# Patient Record
Sex: Male | Born: 1955 | Race: White | Hispanic: No | Marital: Married | Smoking: Former smoker
Health system: Southern US, Community
[De-identification: ages and names within clinical notes are randomized; demographics above are authoritative.]

## PROBLEM LIST (undated history)

## (undated) DIAGNOSIS — N182 Chronic kidney disease, stage 2 (mild): Secondary | ICD-10-CM

## (undated) DIAGNOSIS — I10 Essential (primary) hypertension: Secondary | ICD-10-CM

## (undated) DIAGNOSIS — K219 Gastro-esophageal reflux disease without esophagitis: Secondary | ICD-10-CM

## (undated) DIAGNOSIS — N529 Male erectile dysfunction, unspecified: Secondary | ICD-10-CM

## (undated) DIAGNOSIS — C801 Malignant (primary) neoplasm, unspecified: Secondary | ICD-10-CM

## (undated) DIAGNOSIS — I2510 Atherosclerotic heart disease of native coronary artery without angina pectoris: Secondary | ICD-10-CM

## (undated) DIAGNOSIS — I77810 Thoracic aortic ectasia: Secondary | ICD-10-CM

## (undated) DIAGNOSIS — K760 Fatty (change of) liver, not elsewhere classified: Secondary | ICD-10-CM

## (undated) DIAGNOSIS — Z87442 Personal history of urinary calculi: Secondary | ICD-10-CM

## (undated) DIAGNOSIS — E119 Type 2 diabetes mellitus without complications: Secondary | ICD-10-CM

## (undated) HISTORY — DX: Chronic kidney disease, stage 2 (mild): N18.2

## (undated) HISTORY — DX: Male erectile dysfunction, unspecified: N52.9

## (undated) HISTORY — DX: Gastro-esophageal reflux disease without esophagitis: K21.9

## (undated) HISTORY — DX: Thoracic aortic ectasia: I77.810

## (undated) HISTORY — DX: Type 2 diabetes mellitus without complications: E11.9

## (undated) HISTORY — DX: Atherosclerotic heart disease of native coronary artery without angina pectoris: I25.10

## (undated) HISTORY — DX: Essential (primary) hypertension: I10

## (undated) HISTORY — DX: Fatty (change of) liver, not elsewhere classified: K76.0

---
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
## 1998-12-29 HISTORY — PX: NASAL SINUS SURGERY: SHX719

## 2001-10-04 ENCOUNTER — Encounter: Payer: Self-pay | Admitting: Vascular Surgery

## 2001-10-08 ENCOUNTER — Ambulatory Visit (HOSPITAL_COMMUNITY): Admission: RE | Admit: 2001-10-08 | Discharge: 2001-10-08 | Payer: Self-pay | Admitting: Vascular Surgery

## 2005-12-29 HISTORY — PX: SHOULDER SURGERY: SHX246

## 2009-03-26 LAB — PULMONARY FUNCTION TEST

## 2014-12-29 HISTORY — PX: OTHER SURGICAL HISTORY: SHX169

## 2014-12-29 HISTORY — PX: CATARACT EXTRACTION, BILATERAL: SHX1313

## 2017-10-12 ENCOUNTER — Ambulatory Visit (INDEPENDENT_AMBULATORY_CARE_PROVIDER_SITE_OTHER): Payer: BLUE CROSS/BLUE SHIELD | Admitting: Pulmonary Disease

## 2017-10-12 ENCOUNTER — Encounter: Payer: Self-pay | Admitting: Pulmonary Disease

## 2017-10-12 VITALS — BP 130/84 | HR 90 | Ht 73.0 in | Wt 228.4 lb

## 2017-10-12 DIAGNOSIS — R0600 Dyspnea, unspecified: Secondary | ICD-10-CM

## 2017-10-12 DIAGNOSIS — K219 Gastro-esophageal reflux disease without esophagitis: Secondary | ICD-10-CM | POA: Diagnosis not present

## 2017-10-12 LAB — PULMONARY FUNCTION TEST
DL/VA % pred: 86 %
DL/VA: 4.08 ml/min/mmHg/L
DLCO cor % pred: 91 %
DLCO cor: 32.06 ml/min/mmHg
DLCO unc % pred: 96 %
DLCO unc: 33.65 ml/min/mmHg
FEF 25-75 Post: 2.95 L/s
FEF 25-75 Pre: 2.84 L/s
FEF2575-%Change-Post: 3 %
FEF2575-%Pred-Post: 96 %
FEF2575-%Pred-Pre: 92 %
FEV1-%Change-Post: 1 %
FEV1-%Pred-Post: 106 %
FEV1-%Pred-Pre: 104 %
FEV1-Post: 4.03 L
FEV1-Pre: 3.97 L
FEV1FVC-%Change-Post: 2 %
FEV1FVC-%Pred-Pre: 98 %
FEV6-%Change-Post: 0 %
FEV6-%Pred-Post: 109 %
FEV6-%Pred-Pre: 110 %
FEV6-Post: 5.28 L
FEV6-Pre: 5.33 L
FEV6FVC-%Change-Post: 0 %
FEV6FVC-%Pred-Post: 104 %
FEV6FVC-%Pred-Pre: 104 %
FVC-%Change-Post: 0 %
FVC-%Pred-Post: 105 %
FVC-%Pred-Pre: 106 %
FVC-Post: 5.34 L
FVC-Pre: 5.4 L
Post FEV1/FVC ratio: 75 %
Post FEV6/FVC ratio: 99 %
Pre FEV1/FVC ratio: 74 %
Pre FEV6/FVC Ratio: 99 %
RV % pred: 111 %
RV: 2.67 L
TLC % pred: 108 %
TLC: 8 L

## 2017-10-12 NOTE — Patient Instructions (Addendum)
For your shortness of breath: Today's test did not show COPD but I still think we need to evaluate your lungs given year smoking history. We will arrange for a Pulmonary function test and CT scan of your chest If those are normal then we will arrange for evaluation of your acid reflux and hiatal hernia  For the GERD: Follow the lifestyle modification sheet we gave you  We will see you back in 2 weeks with an NP or sooner if needed

## 2017-10-12 NOTE — Progress Notes (Signed)
Subjective:    Patient ID: Terry Conley, male    DOB: 09-04-1956, 61 y.o.   MRN: [id number]  HPI Chief Complaint  Patient presents with  . Advice Only    Referred by Dr. Quillian Quince for SOB.     Shortness of breath: > he says it is worse with talking.  The more he talks the more his voice goes away > he feels like he has been holding his breath for a long time when he is dyspneic > he notes that he has been sweating more than usual > has been going on for 6-8 months > he says that he was exposed to some mold under his mother's house in April; he had some shortness of breath not long after this > exertion makes him more short of breath > he is more sensitive to strong smells and perfumes > lying flat makes it worse sometimes > no chest pain > some ankle swelling late > some wheezing, again it comes and goes  He was prescribed Anoro by his PCP and he says that this made his breathing worse and made his chest hurt.  He took it for less than a month.  No childhood respiratory illnesses.  He smoked 3/4ppd for 30 years, quit 11-12 years.  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  He worked around this for 10 years.  He didn't recall any breathing difficulty around it.    He has occasional bouts of bronchitis, not annually.  Never hospitalized for a respiratory problem.   He was told by a doctor once that he had COPD, but another physician saw him and disagreed with this diagnosis.  He had PFTs years ago at [facility].   He notes that his A1c is now greater than 7.  He has gained some weight in the last year, perhaps 12-15 pounds.    Cough: > he sometimes produces white mucus, not every day > most days his cough is dry   Past Medical History:  Diagnosis Date  . Diabetes ([place])   . ED (erectile dysfunction)   . GERD (gastroesophageal reflux disease)   . Hypertension       Family History  Problem Relation Age of Onset  . Breast cancer Paternal Grandmother      Social History   Social History  . Marital status: Married    Spouse name: N/A  . Number of children: N/A  . Years of education: N/A   Occupational History  . Not on file.   Social History Main Topics  . Smoking status: Former Smoker    Packs/day: 0.75    Years: 30.00    Types: Cigarettes    Quit date: 10/12/2006  . Smokeless tobacco: Never Used  . Alcohol use Not on file  . Drug use: Unknown  . Sexual activity: Not on file   Other Topics Concern  . Not on file   Social History Narrative  . No narrative on file     Allergies  Allergen Reactions  . Codeine Nausea Only     No outpatient prescriptions prior to visit.   No facility-administered medications prior to visit.       Review of Systems  Constitutional: Negative for fever and unexpected weight change.  HENT: Positive for congestion. Negative for dental problem, ear pain, nosebleeds, postnasal drip, rhinorrhea, sinus pressure, sneezing, sore throat and trouble swallowing.   Eyes: Negative  for redness and itching.  Respiratory: Positive for cough and shortness of breath. Negative for chest tightness and wheezing.   Cardiovascular: Negative for palpitations and leg swelling.  Gastrointestinal: Negative for nausea and vomiting.  Genitourinary: Negative for dysuria.  Musculoskeletal: Negative for joint swelling.  Skin: Negative for rash.  Neurological: Negative for headaches.  Hematological: Does not bruise/bleed easily.  Psychiatric/Behavioral: Negative for dysphoric mood. The patient is not nervous/anxious.        Objective:   Physical Exam Vitals:   10/12/17 1346  BP: 130/84  Pulse: 90  SpO2: 96%  Weight: 228 lb 6.4 oz (103.6 kg)  Height: 6\' 1"  (1.854 m)   RA  Gen: well appearing, no acute distress HENT: NCAT, OP clear, neck supple without masses Eyes: PERRL, EOMi Lymph: no cervical  lymphadenopathy PULM: CTA B CV: RRR, no mgr, no JVD GI: BS+, soft, nontender, no hsm Derm: 2+ ankle edema bilaterally R>L, no rash or skin breakdown MSK: normal bulk and tone Neuro: A&Ox4, CN II-XII intact, strength 5/5 in all 4 extremities Psyche: normal mood and affect   CBC No results found for: WBC, RBC, HGB, HCT, PLT, MCV, MCH, MCHC, RDW, LYMPHSABS, MONOABS, EOSABS, BASOSABS  Chest imaging: 02/2017 CXR : images independently reviewed, I question post sternal emphysema.      Assessment & Plan:   Dyspnea, unspecified type - Plan: Spirometry with Graph, CBC w/Diff, Pulmonary function test, CT Chest Wo Contrast  Gastroesophageal reflux disease, esophagitis presence not specified  Discussion: [name] is at high risk for having COPD considering his smoking history over 30 years. I believe that he may have some emphysema on his chest x-ray from earlier this year. We need to get a spirometry test today in the office to assess for COPD.  That being said, the characteristics of his illness are strongly suggestive of acid reflux causing laryngeal irritation. Specifically the worsening shortness of breath with talking and worsening shortness of breath while lying flat. He says that he has a hiatal hernia and his acid reflux has been worse.  Today's spirometry test did not show evidence of airflow obstruction. I still think we need to assess for lung disease with a full pulmonary function test and a CT given his smoking history. However, if these tests are normal then I think we need to assess his acid reflux and hiatal hernia as a cause of his symptoms.  Plan: For your shortness of breath: Today's test did not show COPD but I still think we need to evaluate your lungs given year smoking history. We will arrange for a Pulmonary function test and CT scan of your chest If those are normal then we will arrange for evaluation of your acid reflux and hiatal hernia  For the GERD: Follow the  lifestyle modification sheet we gave you  We will see you back in 2 weeks with an NP or sooner if needed    Current Outpatient Prescriptions:  .  aspirin EC 81 MG tablet, Take 81 mg by mouth daily., Disp: , Rfl:  .  Cinnamon 500 MG capsule, Take 500 mg by mouth 2 (two) times daily., Disp: , Rfl:  .  Fish Oil-Cholecalciferol (OMEGA-3 FISH OIL-VITAMIN D3) 1200-1000 MG-UNIT CAPS, Take 1 capsule by mouth daily., Disp: , Rfl:  .  indapamide (LOZOL) 2.5 MG tablet, Take 2.5 mg by mouth daily., Disp: , Rfl:  .  lisinopril (PRINIVIL,ZESTRIL) 40 MG tablet, Take 40 mg by mouth daily., Disp: , Rfl:  .  omeprazole (PRILOSEC)  20 MG capsule, Take 20 mg by mouth 2 (two) times daily before a meal., Disp: , Rfl:  .  tamsulosin (FLOMAX) 0.4 MG CAPS capsule, Take 0.4 mg by mouth at bedtime., Disp: , Rfl:  .  testosterone cypionate (DEPOTESTOTERONE CYPIONATE) 100 MG/ML injection, Inject 100 mg into the muscle every 14 (fourteen) days. For IM use only, Disp: , Rfl:

## 2017-10-12 NOTE — Patient Instructions (Signed)
PFT done today. 

## 2017-10-15 ENCOUNTER — Encounter: Payer: Self-pay | Admitting: Pulmonary Disease

## 2017-10-15 DIAGNOSIS — R0600 Dyspnea, unspecified: Secondary | ICD-10-CM | POA: Insufficient documentation

## 2017-10-21 ENCOUNTER — Institutional Professional Consult (permissible substitution): Payer: Self-pay | Admitting: Pulmonary Disease

## 2017-10-28 ENCOUNTER — Ambulatory Visit: Payer: BLUE CROSS/BLUE SHIELD | Admitting: Adult Health

## 2017-11-10 ENCOUNTER — Telehealth: Payer: Self-pay | Admitting: Pulmonary Disease

## 2017-11-10 NOTE — Telephone Encounter (Signed)
Spoke with pt, he states he will request a the results from his scan.and have them fax over the report to [place]. He will have to cancel appointment for tomorrow and hopes BQ would call him with results. I cancelled appt.

## 2017-11-10 NOTE — Telephone Encounter (Signed)
Spoke with pt, states that his wife recently had kidney and pancreas transplant and he cannot leave her alone at this time, and cannot bring her into office visit on 11/14 to discuss CT and PFT results.  After reviewing chart I see pt's PFT results but do not see where pt had CT.  Per pt, he had CT chest at [place].  I advised pt that we do not have access to these images.  As I was speaking to pt and asking him to request CT disk from facility, and line was cut off.  Called pt back, had to lmtcb.

## 2017-11-10 NOTE — Telephone Encounter (Signed)
Patient stated line was disconnected. Patient is calling back.

## 2017-11-11 ENCOUNTER — Ambulatory Visit: Payer: BLUE CROSS/BLUE SHIELD | Admitting: Adult Health

## 2017-11-11 NOTE — Telephone Encounter (Signed)
Message will be routed to [facility] for follow up.

## 2017-11-12 ENCOUNTER — Telehealth: Payer: Self-pay | Admitting: Pulmonary Disease

## 2017-11-12 NOTE — Telephone Encounter (Signed)
Thanks, let him know I'll review ASAP.  The report from [place] said his lungs were OK, please let him know.

## 2017-11-12 NOTE — Telephone Encounter (Signed)
Left a detailed message letting pt know BQ message. Advised to call back if he had any questions. Will close encounter.

## 2017-11-12 NOTE — Telephone Encounter (Signed)
BQ the disc will be placed in your look at folder.

## 2017-11-12 NOTE — Telephone Encounter (Signed)
Called and spoke to [place] with [facility] and requested that disc be mailed to our office.  Will route to [place] to f/u on.

## 2017-11-12 NOTE — Telephone Encounter (Signed)
We need the images from that scan sent to the office please.

## 2017-11-18 NOTE — Telephone Encounter (Signed)
Terry Conley, have you received the images for this patient? Thanks.

## 2017-11-18 NOTE — Telephone Encounter (Signed)
Images were received. Caryl Pina has them. BQ will go over results. Nothing further needed at this time.

## 2018-08-02 ENCOUNTER — Encounter: Payer: Self-pay | Admitting: *Deleted

## 2018-08-02 ENCOUNTER — Ambulatory Visit: Payer: BLUE CROSS/BLUE SHIELD | Admitting: Cardiovascular Disease

## 2018-08-02 ENCOUNTER — Encounter: Payer: Self-pay | Admitting: Cardiovascular Disease

## 2018-08-02 VITALS — BP 118/80 | HR 97 | Ht 73.0 in | Wt 236.0 lb

## 2018-08-02 DIAGNOSIS — I10 Essential (primary) hypertension: Secondary | ICD-10-CM

## 2018-08-02 DIAGNOSIS — E119 Type 2 diabetes mellitus without complications: Secondary | ICD-10-CM

## 2018-08-02 DIAGNOSIS — Z01812 Encounter for preprocedural laboratory examination: Secondary | ICD-10-CM | POA: Diagnosis not present

## 2018-08-02 DIAGNOSIS — R0609 Other forms of dyspnea: Secondary | ICD-10-CM

## 2018-08-02 DIAGNOSIS — R0600 Dyspnea, unspecified: Secondary | ICD-10-CM

## 2018-08-02 MED ORDER — METOPROLOL TARTRATE 50 MG PO TABS: 50.0000 mg | ORAL_TABLET | Freq: Once | ORAL | 0 refills | Status: DC

## 2018-08-02 NOTE — Patient Instructions (Signed)
Medication Instructions:  Continue all current medications.  Labwork:  BMET - order given today.   Do just prior to CT.  Testing/Procedures:  Coronary CT - done at [facility] will contact with results via phone or letter.    Follow-Up: 2 months   Any Other Special Instructions Will Be Listed Below (If Applicable).  If you need a refill on your cardiac medications before your next appointment, please call your pharmacy.

## 2018-08-02 NOTE — Progress Notes (Signed)
CARDIOLOGY CONSULT NOTE  Patient ID: Terry Conley MRN: [id number] DOB/AGE: 08-25-1956 62 y.o.  Admit date: (Not on file) Primary Physician: Caryl Bis, MD Referring Physician: Dr. Quillian Quince  Reason for Consultation: Exertional dyspnea  HPI: Terry Conley is a 62 y.o. male who is being seen today for the evaluation of exertional dyspnea at the request of Caryl Bis, MD.  I reviewed notes from his PCP.  I reviewed labs dated 06/03/2018: Hemoglobin 14.9, platelets 228, white blood cells 5.8, TSH 4, total CK 196.  I reviewed the echocardiogram report dated 05/26/2017 performed at [facility] which demonstrated normal left ventricular systolic and diastolic function, LVEF 55 to 60%.  He has been evaluated by pulmonary as well.  Chest CT in October 2018 did not demonstrate any pulmonary abnormalities.  ECG performed in the office today which I ordered and personally interpreted demonstrates normal sinus rhythm with frequent PVCs.  Upon speaking with him further, it appears he has been expensing exertional dyspnea for the past 8 or 9 months.  He describes a chest fullness like "being stuffed with cotton "but denies chest pain per se.  When walking out in the yard he becomes quickly diaphoretic.  He denies orthopnea and paroxysmal nocturnal dyspnea.  He said "I hurt all over "and describes diffuse body pain.  He has occasional right leg swelling but did have right varicose vein stripping years ago.  He also complains of bilateral hip pain.  Family history: Father had coronary artery disease, MIs, and carotid endarterectomy in his 17s.   Allergies  Allergen Reactions  . Codeine Nausea Only    Current Outpatient Medications  Medication Sig Dispense Refill  . aspirin EC 81 MG tablet Take 81 mg by mouth daily.    Marland Kitchen atorvastatin (LIPITOR) 10 MG tablet Take 10 mg by mouth once a week.    . Cinnamon 500 MG capsule Take 500 mg by mouth 2 (two) times daily.    . Fish  Oil-Cholecalciferol (OMEGA-3 FISH OIL-VITAMIN D3) 1200-1000 MG-UNIT CAPS Take 1 capsule by mouth daily.    . indapamide (LOZOL) 2.5 MG tablet Take 2.5 mg by mouth daily.    Marland Kitchen lisinopril (PRINIVIL,ZESTRIL) 20 MG tablet Take 20 mg by mouth daily.    . metFORMIN (GLUCOPHAGE) 500 MG tablet Take 500 mg by mouth 2 (two) times daily with a meal.    . omeprazole (PRILOSEC) 20 MG capsule Take 20 mg by mouth 2 (two) times daily before a meal.    . Specialty Vitamins Products (PROSTATE PO) Take by mouth. OTC 3 TIMES DAILY    . tamsulosin (FLOMAX) 0.4 MG CAPS capsule Take 0.4 mg by mouth at bedtime.    Marland Kitchen testosterone cypionate (DEPOTESTOTERONE CYPIONATE) 100 MG/ML injection Inject 100 mg into the muscle every 14 (fourteen) days. For IM use only     No current facility-administered medications for this visit.     Past Medical History:  Diagnosis Date  . Diabetes ([place])   . ED (erectile dysfunction)   . GERD (gastroesophageal reflux disease)   . Hypertension     Past Surgical History:  Procedure Laterality Date  . APPENDECTOMY  1961  . CATARACT EXTRACTION, BILATERAL  2016  . hammertoe repair Right 2016  . NASAL SINUS SURGERY  2000   deviated septum, polyp removal  . SHOULDER SURGERY Left 2007    Social History   Socioeconomic History  . Marital status: Married    Spouse name: Not on file  .  Number of children: Not on file  . Years of education: Not on file  . Highest education level: Not on file  Occupational History  . Not on file  Social Needs  . Financial resource strain: Not on file  . Food insecurity:    Worry: Not on file    Inability: Not on file  . Transportation needs:    Medical: Not on file    Non-medical: Not on file  Tobacco Use  . Smoking status: Former Smoker    Packs/day: 0.75    Years: 30.00    Pack years: 22.50    Types: Cigarettes    Last attempt to quit: 10/12/2006    Years since quitting: 11.8  . Smokeless tobacco: Never Used  Substance and Sexual  Activity  . Alcohol use: Not on file  . Drug use: Not on file  . Sexual activity: Not on file  Lifestyle  . Physical activity:    Days per week: Not on file    Minutes per session: Not on file  . Stress: Not on file  Relationships  . Social connections:    Talks on phone: Not on file    Gets together: Not on file    Attends religious service: Not on file    Active member of club or organization: Not on file    Attends meetings of clubs or organizations: Not on file    Relationship status: Not on file  . Intimate partner violence:    Fear of current or ex partner: Not on file    Emotionally abused: Not on file    Physically abused: Not on file    Forced sexual activity: Not on file  Other Topics Concern  . Not on file  Social History Narrative  . Not on file      Current Meds  Medication Sig  . aspirin EC 81 MG tablet Take 81 mg by mouth daily.  Marland Kitchen atorvastatin (LIPITOR) 10 MG tablet Take 10 mg by mouth once a week.  . Cinnamon 500 MG capsule Take 500 mg by mouth 2 (two) times daily.  . Fish Oil-Cholecalciferol (OMEGA-3 FISH OIL-VITAMIN D3) 1200-1000 MG-UNIT CAPS Take 1 capsule by mouth daily.  . indapamide (LOZOL) 2.5 MG tablet Take 2.5 mg by mouth daily.  Marland Kitchen lisinopril (PRINIVIL,ZESTRIL) 20 MG tablet Take 20 mg by mouth daily.  . metFORMIN (GLUCOPHAGE) 500 MG tablet Take 500 mg by mouth 2 (two) times daily with a meal.  . omeprazole (PRILOSEC) 20 MG capsule Take 20 mg by mouth 2 (two) times daily before a meal.  . Specialty Vitamins Products (PROSTATE PO) Take by mouth. OTC 3 TIMES DAILY  . tamsulosin (FLOMAX) 0.4 MG CAPS capsule Take 0.4 mg by mouth at bedtime.  Marland Kitchen testosterone cypionate (DEPOTESTOTERONE CYPIONATE) 100 MG/ML injection Inject 100 mg into the muscle every 14 (fourteen) days. For IM use only  . [DISCONTINUED] lisinopril (PRINIVIL,ZESTRIL) 40 MG tablet Take 40 mg by mouth daily.      Review of systems complete and found to be negative unless listed above in  HPI    Physical exam Blood pressure 118/80, pulse 97, height 6\' 1"  (1.854 m), weight 236 lb (107 kg), SpO2 99 %. General: NAD Neck: No JVD, no thyromegaly or thyroid nodule.  Lungs: Clear to auscultation bilaterally with normal respiratory effort. CV: Nondisplaced PMI. Regular rate and rhythm, normal S1/S2, no S3/S4, no murmur.  No peripheral edema.  No carotid bruit.  Abdomen: Soft, nontender, no distention.  Skin: Intact without lesions or rashes.  Neurologic: Alert and oriented x 3.  Psych: Normal affect. Extremities: No clubbing or cyanosis.  HEENT: Normal.   ECG: Most recent ECG reviewed.   Labs: No results found for: K, BUN, CREATININE, ALT, TSH, HGB   Lipids: No results found for: LDLCALC, LDLDIRECT, CHOL, TRIG, HDL      ASSESSMENT AND PLAN:  1.  Exertional dyspnea: Pulmonary etiology has been ruled out.  He does have hyperlipidemia and type 2 diabetes mellitus along with hypertension.  I will obtain coronary CT angiography to assess for coronary artery disease.  2.  Hypertension: Blood pressures have been fluctuating recently.  It is presently controlled.  No changes to therapy.  He is on lisinopril 20 mg.  3.  Type 2 diabetes mellitus: Currently on metformin and Lipitor.     Disposition: Follow up in 8 to 10 weeks  Signed: Kate Sable, M.D., F.A.C.C.  08/02/2018, 2:43 PM

## 2018-08-16 LAB — BASIC METABOLIC PANEL
BUN: 10 mg/dL (ref 7–25)
CHLORIDE: 105 mmol/L (ref 98–110)
CO2: 28 mmol/L (ref 20–32)
CREATININE: 1.15 mg/dL (ref 0.70–1.25)
Calcium: 9.3 mg/dL (ref 8.6–10.3)
Glucose, Bld: 107 mg/dL (ref 65–139)
POTASSIUM: 4.4 mmol/L (ref 3.5–5.3)
SODIUM: 139 mmol/L (ref 135–146)

## 2018-08-19 ENCOUNTER — Ambulatory Visit (HOSPITAL_COMMUNITY)
Admission: RE | Admit: 2018-08-19 | Discharge: 2018-08-19 | Disposition: A | Discharge status: Home / Self Care | Payer: BLUE CROSS/BLUE SHIELD | Source: Ambulatory Visit | Attending: Cardiovascular Disease | Admitting: Cardiovascular Disease

## 2018-08-19 DIAGNOSIS — R0600 Dyspnea, unspecified: Secondary | ICD-10-CM

## 2018-08-19 DIAGNOSIS — I77810 Thoracic aortic ectasia: Secondary | ICD-10-CM | POA: Diagnosis not present

## 2018-08-19 DIAGNOSIS — R0609 Other forms of dyspnea: Secondary | ICD-10-CM | POA: Diagnosis present

## 2018-08-19 DIAGNOSIS — J9811 Atelectasis: Secondary | ICD-10-CM | POA: Diagnosis not present

## 2018-08-19 DIAGNOSIS — I2510 Atherosclerotic heart disease of native coronary artery without angina pectoris: Secondary | ICD-10-CM | POA: Diagnosis not present

## 2018-08-19 MED ORDER — IOPAMIDOL (ISOVUE-370) INJECTION 76%
100.0000 mL | Freq: Once | INTRAVENOUS | Status: AC | PRN
  Administered 2018-08-19: 100 mL via INTRAVENOUS

## 2018-08-19 MED ORDER — METOPROLOL TARTRATE 5 MG/5ML IV SOLN
INTRAVENOUS | Status: AC
  Administered 2018-08-19: 5 mg via INTRAVENOUS
  Filled 2018-08-19: qty 10

## 2018-08-19 MED ORDER — NITROGLYCERIN 0.4 MG SL SUBL
0.8000 mg | SUBLINGUAL_TABLET | SUBLINGUAL | Status: DC | PRN
  Administered 2018-08-19: 0.8 mg via SUBLINGUAL
  Filled 2018-08-19 (×2): qty 25

## 2018-08-19 MED ORDER — NITROGLYCERIN 0.4 MG SL SUBL
SUBLINGUAL_TABLET | SUBLINGUAL | Status: AC
  Administered 2018-08-19: 0.8 mg via SUBLINGUAL
  Filled 2018-08-19: qty 2

## 2018-08-19 MED ORDER — METOPROLOL TARTRATE 5 MG/5ML IV SOLN
5.0000 mg | INTRAVENOUS | Status: DC | PRN
  Administered 2018-08-19: 5 mg via INTRAVENOUS
  Filled 2018-08-19 (×2): qty 5

## 2018-08-23 ENCOUNTER — Telehealth: Payer: Self-pay | Admitting: Cardiovascular Disease

## 2018-08-23 ENCOUNTER — Encounter: Payer: Self-pay | Admitting: *Deleted

## 2018-08-23 ENCOUNTER — Ambulatory Visit (INDEPENDENT_AMBULATORY_CARE_PROVIDER_SITE_OTHER): Payer: BLUE CROSS/BLUE SHIELD | Admitting: Cardiovascular Disease

## 2018-08-23 ENCOUNTER — Other Ambulatory Visit: Payer: Self-pay | Admitting: Cardiovascular Disease

## 2018-08-23 ENCOUNTER — Encounter: Payer: Self-pay | Admitting: Cardiovascular Disease

## 2018-08-23 VITALS — BP 118/80 | HR 104 | Ht 73.0 in | Wt 229.0 lb

## 2018-08-23 DIAGNOSIS — R0600 Dyspnea, unspecified: Secondary | ICD-10-CM

## 2018-08-23 DIAGNOSIS — I10 Essential (primary) hypertension: Secondary | ICD-10-CM

## 2018-08-23 DIAGNOSIS — R0609 Other forms of dyspnea: Secondary | ICD-10-CM | POA: Diagnosis not present

## 2018-08-23 DIAGNOSIS — I209 Angina pectoris, unspecified: Secondary | ICD-10-CM | POA: Diagnosis not present

## 2018-08-23 DIAGNOSIS — Z01812 Encounter for preprocedural laboratory examination: Secondary | ICD-10-CM

## 2018-08-23 DIAGNOSIS — E119 Type 2 diabetes mellitus without complications: Secondary | ICD-10-CM

## 2018-08-23 DIAGNOSIS — I25118 Atherosclerotic heart disease of native coronary artery with other forms of angina pectoris: Secondary | ICD-10-CM

## 2018-08-23 MED ORDER — ATORVASTATIN CALCIUM 40 MG PO TABS: 40.0000 mg | ORAL_TABLET | Freq: Every day | ORAL | 6 refills | Status: DC

## 2018-08-23 MED ORDER — METOPROLOL TARTRATE 25 MG PO TABS: 25.0000 mg | ORAL_TABLET | Freq: Two times a day (BID) | ORAL | 6 refills | Status: DC

## 2018-08-23 NOTE — Addendum Note (Signed)
Addended by: Laurine Blazer on: 08/23/2018 02:13 PM   Modules accepted: Orders

## 2018-08-23 NOTE — Progress Notes (Signed)
SUBJECTIVE: The patient returns for follow-up after undergoing cardiovascular testing performed for the evaluation of exertional dyspnea.  Coronary CT angiography was suggestive of significant stenosis in the mid to distal LAD and distal AV groove branch of circumflex.  He continues to experience exertional dyspnea with minimal activity.  It is associated with diaphoresis.  He also continues to describe a fullness in the chest.  He denies pain per se.     Review of Systems: As per "subjective", otherwise negative.  Allergies  Allergen Reactions  . Codeine Nausea Only    Current Outpatient Medications  Medication Sig Dispense Refill  . aspirin EC 81 MG tablet Take 81 mg by mouth daily.    Marland Kitchen atorvastatin (LIPITOR) 10 MG tablet Take 10 mg by mouth once a week.    . Cinnamon 500 MG capsule Take 500 mg by mouth 2 (two) times daily.    . Fish Oil-Cholecalciferol (OMEGA-3 FISH OIL-VITAMIN D3) 1200-1000 MG-UNIT CAPS Take 1 capsule by mouth daily.    . indapamide (LOZOL) 2.5 MG tablet Take 2.5 mg by mouth daily.    Marland Kitchen lisinopril (PRINIVIL,ZESTRIL) 20 MG tablet Take 20 mg by mouth daily.    . metFORMIN (GLUCOPHAGE) 500 MG tablet Take 500 mg by mouth 2 (two) times daily with a meal.    . metoprolol tartrate (LOPRESSOR) 50 MG tablet Take 1 tablet (50 mg total) by mouth once for 1 dose. 1 tablet 0  . omeprazole (PRILOSEC) 20 MG capsule Take 20 mg by mouth 2 (two) times daily before a meal.    . Specialty Vitamins Products (PROSTATE PO) Take by mouth. OTC 3 TIMES DAILY    . tamsulosin (FLOMAX) 0.4 MG CAPS capsule Take 0.4 mg by mouth at bedtime.    Marland Kitchen testosterone cypionate (DEPOTESTOTERONE CYPIONATE) 100 MG/ML injection Inject 100 mg into the muscle every 14 (fourteen) days. For IM use only     No current facility-administered medications for this visit.     Past Medical History:  Diagnosis Date  . Diabetes ([place])   . ED (erectile dysfunction)   . GERD (gastroesophageal reflux  disease)   . Hypertension     Past Surgical History:  Procedure Laterality Date  . APPENDECTOMY  1961  . CATARACT EXTRACTION, BILATERAL  2016  . hammertoe repair Right 2016  . NASAL SINUS SURGERY  2000   deviated septum, polyp removal  . SHOULDER SURGERY Left 2007    Social History   Socioeconomic History  . Marital status: Married    Spouse name: Not on file  . Number of children: Not on file  . Years of education: Not on file  . Highest education level: Not on file  Occupational History  . Not on file  Social Needs  . Financial resource strain: Not on file  . Food insecurity:    Worry: Not on file    Inability: Not on file  . Transportation needs:    Medical: Not on file    Non-medical: Not on file  Tobacco Use  . Smoking status: Former Smoker    Packs/day: 0.75    Years: 30.00    Pack years: 22.50    Types: Cigarettes    Last attempt to quit: 10/12/2006    Years since quitting: 11.8  . Smokeless tobacco: Never Used  Substance and Sexual Activity  . Alcohol use: Not on file  . Drug use: Not on file  . Sexual activity: Not on file  Lifestyle  .  Physical activity:    Days per week: Not on file    Minutes per session: Not on file  . Stress: Not on file  Relationships  . Social connections:    Talks on phone: Not on file    Gets together: Not on file    Attends religious service: Not on file    Active member of club or organization: Not on file    Attends meetings of clubs or organizations: Not on file    Relationship status: Not on file  . Intimate partner violence:    Fear of current or ex partner: Not on file    Emotionally abused: Not on file    Physically abused: Not on file    Forced sexual activity: Not on file  Other Topics Concern  . Not on file  Social History Narrative  . Not on file     Vitals:   08/23/18 1335  BP: 118/80  Pulse: (!) 104  SpO2: 98%  Weight: 229 lb (103.9 kg)  Height: 6\' 1"  (1.854 m)    Wt Readings from Last 3  Encounters:  08/23/18 229 lb (103.9 kg)  08/02/18 236 lb (107 kg)  10/12/17 228 lb 6.4 oz (103.6 kg)     PHYSICAL EXAM General: NAD HEENT: Normal. Neck: No JVD, no thyromegaly. Lungs: Clear to auscultation bilaterally with normal respiratory effort. CV: Regular rate and rhythm, normal S1/S2, no S3/S4, no murmur. No pretibial or periankle edema.  No carotid bruit.   Abdomen: Soft, nontender, no distention.  Neurologic: Alert and oriented.  Psych: Normal affect. Skin: Normal. Musculoskeletal: No gross deformities.    ECG: Reviewed above under Subjective   Labs: Lab Results  Component Value Date/Time   K 4.4 08/16/2018 10:37 AM   BUN 10 08/16/2018 10:37 AM   CREATININE 1.15 08/16/2018 10:37 AM     Lipids: No results found for: LDLCALC, LDLDIRECT, CHOL, TRIG, HDL     ASSESSMENT AND PLAN: 1.  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  He is on aspirin and atorvastatin 10 mg.  I will increase atorvastatin to 40 mg.  I will start metoprolol tartrate 25 mg twice daily.  I will also arrange for left heart catheterization and coronary angiography. Risks and benefits of cardiac catheterization have been discussed with the patient.  These include bleeding, infection, kidney damage, stroke, heart attack, death.  The patient understands these risks and is willing to proceed.  2.  Hypertension: Blood pressures are normal.  I will monitor given addition of metoprolol as noted above.  3.  Type 2 diabetes mellitus: Currently on metformin and Lipitor.  I will increase Lipitor to 40 mg for reasons mentioned above (coronary artery disease).   Disposition: Follow up 1 month  A high level of decision making was required for increased medical complexities.    Kate Sable, M.D., F.A.C.C.

## 2018-08-23 NOTE — H&P (View-Only) (Signed)
SUBJECTIVE: The patient returns for follow-up after undergoing cardiovascular testing performed for the evaluation of exertional dyspnea.  Coronary CT angiography was suggestive of significant stenosis in the mid to distal LAD and distal AV groove branch of circumflex.  He continues to experience exertional dyspnea with minimal activity.  It is associated with diaphoresis.  He also continues to describe a fullness in the chest.  He denies pain per se.     Review of Systems: As per "subjective", otherwise negative.  Allergies  Allergen Reactions  . Codeine Nausea Only    Current Outpatient Medications  Medication Sig Dispense Refill  . aspirin EC 81 MG tablet Take 81 mg by mouth daily.    Marland Kitchen atorvastatin (LIPITOR) 10 MG tablet Take 10 mg by mouth once a week.    . Cinnamon 500 MG capsule Take 500 mg by mouth 2 (two) times daily.    . Fish Oil-Cholecalciferol (OMEGA-3 FISH OIL-VITAMIN D3) 1200-1000 MG-UNIT CAPS Take 1 capsule by mouth daily.    . indapamide (LOZOL) 2.5 MG tablet Take 2.5 mg by mouth daily.    Marland Kitchen lisinopril (PRINIVIL,ZESTRIL) 20 MG tablet Take 20 mg by mouth daily.    . metFORMIN (GLUCOPHAGE) 500 MG tablet Take 500 mg by mouth 2 (two) times daily with a meal.    . metoprolol tartrate (LOPRESSOR) 50 MG tablet Take 1 tablet (50 mg total) by mouth once for 1 dose. 1 tablet 0  . omeprazole (PRILOSEC) 20 MG capsule Take 20 mg by mouth 2 (two) times daily before a meal.    . Specialty Vitamins Products (PROSTATE PO) Take by mouth. OTC 3 TIMES DAILY    . tamsulosin (FLOMAX) 0.4 MG CAPS capsule Take 0.4 mg by mouth at bedtime.    Marland Kitchen testosterone cypionate (DEPOTESTOTERONE CYPIONATE) 100 MG/ML injection Inject 100 mg into the muscle every 14 (fourteen) days. For IM use only     No current facility-administered medications for this visit.     Past Medical History:  Diagnosis Date  . Diabetes ([place])   . ED (erectile dysfunction)   . GERD (gastroesophageal reflux  disease)   . Hypertension     Past Surgical History:  Procedure Laterality Date  . APPENDECTOMY  1961  . CATARACT EXTRACTION, BILATERAL  2016  . hammertoe repair Right 2016  . NASAL SINUS SURGERY  2000   deviated septum, polyp removal  . SHOULDER SURGERY Left 2007    Social History   Socioeconomic History  . Marital status: Married    Spouse name: Not on file  . Number of children: Not on file  . Years of education: Not on file  . Highest education level: Not on file  Occupational History  . Not on file  Social Needs  . Financial resource strain: Not on file  . Food insecurity:    Worry: Not on file    Inability: Not on file  . Transportation needs:    Medical: Not on file    Non-medical: Not on file  Tobacco Use  . Smoking status: Former Smoker    Packs/day: 0.75    Years: 30.00    Pack years: 22.50    Types: Cigarettes    Last attempt to quit: 10/12/2006    Years since quitting: 11.8  . Smokeless tobacco: Never Used  Substance and Sexual Activity  . Alcohol use: Not on file  . Drug use: Not on file  . Sexual activity: Not on file  Lifestyle  .  Physical activity:    Days per week: Not on file    Minutes per session: Not on file  . Stress: Not on file  Relationships  . Social connections:    Talks on phone: Not on file    Gets together: Not on file    Attends religious service: Not on file    Active member of club or organization: Not on file    Attends meetings of clubs or organizations: Not on file    Relationship status: Not on file  . Intimate partner violence:    Fear of current or ex partner: Not on file    Emotionally abused: Not on file    Physically abused: Not on file    Forced sexual activity: Not on file  Other Topics Concern  . Not on file  Social History Narrative  . Not on file     Vitals:   08/23/18 1335  BP: 118/80  Pulse: (!) 104  SpO2: 98%  Weight: 229 lb (103.9 kg)  Height: 6\' 1"  (1.854 m)    Wt Readings from Last 3  Encounters:  08/23/18 229 lb (103.9 kg)  08/02/18 236 lb (107 kg)  10/12/17 228 lb 6.4 oz (103.6 kg)     PHYSICAL EXAM General: NAD HEENT: Normal. Neck: No JVD, no thyromegaly. Lungs: Clear to auscultation bilaterally with normal respiratory effort. CV: Regular rate and rhythm, normal S1/S2, no S3/S4, no murmur. No pretibial or periankle edema.  No carotid bruit.   Abdomen: Soft, nontender, no distention.  Neurologic: Alert and oriented.  Psych: Normal affect. Skin: Normal. Musculoskeletal: No gross deformities.    ECG: Reviewed above under Subjective   Labs: Lab Results  Component Value Date/Time   K 4.4 08/16/2018 10:37 AM   BUN 10 08/16/2018 10:37 AM   CREATININE 1.15 08/16/2018 10:37 AM     Lipids: No results found for: LDLCALC, LDLDIRECT, CHOL, TRIG, HDL     ASSESSMENT AND PLAN: 1.  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  He is on aspirin and atorvastatin 10 mg.  I will increase atorvastatin to 40 mg.  I will start metoprolol tartrate 25 mg twice daily.  I will also arrange for left heart catheterization and coronary angiography. Risks and benefits of cardiac catheterization have been discussed with the patient.  These include bleeding, infection, kidney damage, stroke, heart attack, death.  The patient understands these risks and is willing to proceed.  2.  Hypertension: Blood pressures are normal.  I will monitor given addition of metoprolol as noted above.  3.  Type 2 diabetes mellitus: Currently on metformin and Lipitor.  I will increase Lipitor to 40 mg for reasons mentioned above (coronary artery disease).   Disposition: Follow up 1 month  A high level of decision making was required for increased medical complexities.    Kate Sable, M.D., F.A.C.C.

## 2018-08-23 NOTE — Telephone Encounter (Signed)
°  Precert needed for: Left heart cath - Kelly - 8/30 - 9:00    Location: [facility]    Date:

## 2018-08-23 NOTE — Patient Instructions (Signed)
Medication Instructions:   Increase Lipitor to 40mg  daily.  Begin Lopressor 25mg  twice a day.  Continue all other medications.    Labwork: BMET, CBC - orders given today.   Testing/Procedures: Your physician has requested that you have a cardiac catheterization. Cardiac catheterization is used to diagnose and/or treat various heart conditions. Doctors may recommend this procedure for a number of different reasons. The most common reason is to evaluate chest pain. Chest pain can be a symptom of coronary artery disease (CAD), and cardiac catheterization can show whether plaque is narrowing or blocking your heart's arteries. This procedure is also used to evaluate the valves, as well as measure the blood flow and oxygen levels in different parts of your heart. For further information please visit [email or web address]. Please follow instruction sheet, as given.  Follow-Up: 1 month   Any Other Special Instructions Will Be Listed Below (If Applicable).  If you need a refill on your cardiac medications before your next appointment, please call your pharmacy.

## 2018-08-24 ENCOUNTER — Telehealth: Payer: Self-pay | Admitting: *Deleted

## 2018-08-24 LAB — CBC
HEMATOCRIT: 45.8 % (ref 38.5–50.0)
HEMOGLOBIN: 14.7 g/dL (ref 13.2–17.1)
MCH: 26.2 pg — ABNORMAL LOW (ref 27.0–33.0)
MCHC: 32.1 g/dL (ref 32.0–36.0)
MCV: 81.6 fL (ref 80.0–100.0)
MPV: 11.3 fL (ref 7.5–12.5)
Platelets: 312 10*3/uL (ref 140–400)
RBC: 5.61 10*6/uL (ref 4.20–5.80)
RDW: 13.8 % (ref 11.0–15.0)
WBC: 4.2 10*3/uL (ref 3.8–10.8)

## 2018-08-24 LAB — BASIC METABOLIC PANEL
BUN / CREAT RATIO: 9 (calc) (ref 6–22)
BUN: 12 mg/dL (ref 7–25)
CHLORIDE: 103 mmol/L (ref 98–110)
CO2: 28 mmol/L (ref 20–32)
Calcium: 9.2 mg/dL (ref 8.6–10.3)
Creat: 1.27 mg/dL — ABNORMAL HIGH (ref 0.70–1.25)
GLUCOSE: 183 mg/dL — AB (ref 65–139)
Potassium: 3.9 mmol/L (ref 3.5–5.3)
Sodium: 139 mmol/L (ref 135–146)

## 2018-08-24 NOTE — Telephone Encounter (Signed)
Notes recorded by Laurine Blazer, LPN on 02/21/96 at 9:49 PM EDT Patient notified and verbalized understanding. Copy to pmd. ------  Notes recorded by Herminio Commons, MD on 08/24/2018 at 1:40 PM EDT Creatinine only mildly elevated. Encourage adequate water intake.

## 2018-08-25 ENCOUNTER — Telehealth: Payer: Self-pay | Admitting: *Deleted

## 2018-08-25 NOTE — Telephone Encounter (Signed)
Pt contacted pre-catheterization scheduled at [facility] for: Friday August 27, 2018 9 AM Verified arrival time and place: [facility] Entrance A at: 7AM  No solid food after midnight prior to cath, clear liquids until 5 AM day of procedure. Verified allergies in Epic  Hold: Metformin -Day of procedure and 48 post procedure.  Except hold AM meds can be  taken pre-cath with sip of water including: ASA 81 mg  Confirmed patient has responsible person to drive home post procedure and for 24 hours after you arrive home: yes

## 2018-08-27 ENCOUNTER — Encounter (HOSPITAL_COMMUNITY)
Admission: RE | Disposition: A | Discharge status: Home / Self Care | Payer: Self-pay | Source: Ambulatory Visit | Attending: Cardiovascular Disease

## 2018-08-27 ENCOUNTER — Encounter (HOSPITAL_COMMUNITY): Payer: Self-pay | Admitting: Cardiovascular Disease

## 2018-08-27 ENCOUNTER — Ambulatory Visit (HOSPITAL_COMMUNITY)
Admission: RE | Admit: 2018-08-27 | Discharge: 2018-08-27 | Disposition: A | Discharge status: Home / Self Care | Payer: BLUE CROSS/BLUE SHIELD | Source: Ambulatory Visit | Attending: Cardiovascular Disease | Admitting: Cardiovascular Disease

## 2018-08-27 DIAGNOSIS — Z87891 Personal history of nicotine dependence: Secondary | ICD-10-CM | POA: Insufficient documentation

## 2018-08-27 DIAGNOSIS — Z7982 Long term (current) use of aspirin: Secondary | ICD-10-CM | POA: Diagnosis not present

## 2018-08-27 DIAGNOSIS — N529 Male erectile dysfunction, unspecified: Secondary | ICD-10-CM | POA: Insufficient documentation

## 2018-08-27 DIAGNOSIS — K219 Gastro-esophageal reflux disease without esophagitis: Secondary | ICD-10-CM | POA: Insufficient documentation

## 2018-08-27 DIAGNOSIS — I25118 Atherosclerotic heart disease of native coronary artery with other forms of angina pectoris: Secondary | ICD-10-CM | POA: Insufficient documentation

## 2018-08-27 DIAGNOSIS — E119 Type 2 diabetes mellitus without complications: Secondary | ICD-10-CM | POA: Insufficient documentation

## 2018-08-27 DIAGNOSIS — Z7984 Long term (current) use of oral hypoglycemic drugs: Secondary | ICD-10-CM | POA: Diagnosis not present

## 2018-08-27 DIAGNOSIS — Z9842 Cataract extraction status, left eye: Secondary | ICD-10-CM | POA: Diagnosis not present

## 2018-08-27 DIAGNOSIS — I2510 Atherosclerotic heart disease of native coronary artery without angina pectoris: Secondary | ICD-10-CM

## 2018-08-27 DIAGNOSIS — Z79899 Other long term (current) drug therapy: Secondary | ICD-10-CM | POA: Diagnosis not present

## 2018-08-27 DIAGNOSIS — I209 Angina pectoris, unspecified: Secondary | ICD-10-CM

## 2018-08-27 DIAGNOSIS — I10 Essential (primary) hypertension: Secondary | ICD-10-CM | POA: Insufficient documentation

## 2018-08-27 DIAGNOSIS — Z9889 Other specified postprocedural states: Secondary | ICD-10-CM | POA: Diagnosis not present

## 2018-08-27 DIAGNOSIS — Z885 Allergy status to narcotic agent status: Secondary | ICD-10-CM | POA: Insufficient documentation

## 2018-08-27 DIAGNOSIS — Z9841 Cataract extraction status, right eye: Secondary | ICD-10-CM | POA: Insufficient documentation

## 2018-08-27 HISTORY — PX: LEFT HEART CATH AND CORONARY ANGIOGRAPHY: CATH118249

## 2018-08-27 LAB — GLUCOSE, CAPILLARY: Glucose-Capillary: 130 mg/dL — ABNORMAL HIGH (ref 70–99)

## 2018-08-27 SURGERY — LEFT HEART CATH AND CORONARY ANGIOGRAPHY
Anesthesia: LOCAL

## 2018-08-27 MED ORDER — SODIUM CHLORIDE 0.9 % IV SOLN: INTRAVENOUS | Status: DC

## 2018-08-27 MED ORDER — HEPARIN (PORCINE) IN NACL 1000-0.9 UT/500ML-% IV SOLN
INTRAVENOUS | Status: DC | PRN
  Administered 2018-08-27 (×2): 500 mL

## 2018-08-27 MED ORDER — HEPARIN SODIUM (PORCINE) 1000 UNIT/ML IJ SOLN
INTRAMUSCULAR | Status: DC | PRN
  Administered 2018-08-27: 5000 [IU] via INTRAVENOUS

## 2018-08-27 MED ORDER — ONDANSETRON HCL 4 MG/2ML IJ SOLN: 4.0000 mg | Freq: Four times a day (QID) | INTRAMUSCULAR | Status: DC | PRN

## 2018-08-27 MED ORDER — SODIUM CHLORIDE 0.9 % IV SOLN: 250.0000 mL | INTRAVENOUS | Status: DC | PRN

## 2018-08-27 MED ORDER — SODIUM CHLORIDE 0.9 % WEIGHT BASED INFUSION
3.0000 mL/kg/h | INTRAVENOUS | Status: DC
  Administered 2018-08-27: 3 mL/kg/h via INTRAVENOUS

## 2018-08-27 MED ORDER — LIDOCAINE HCL (PF) 1 % IJ SOLN
INTRAMUSCULAR | Status: AC
  Filled 2018-08-27: qty 30

## 2018-08-27 MED ORDER — DIAZEPAM 5 MG PO TABS: 5.0000 mg | ORAL_TABLET | Freq: Four times a day (QID) | ORAL | Status: DC | PRN

## 2018-08-27 MED ORDER — LIDOCAINE HCL (PF) 1 % IJ SOLN
INTRAMUSCULAR | Status: DC | PRN
  Administered 2018-08-27: 2 mL via INTRADERMAL

## 2018-08-27 MED ORDER — ASPIRIN 81 MG PO CHEW: 81.0000 mg | CHEWABLE_TABLET | ORAL | Status: DC

## 2018-08-27 MED ORDER — HEPARIN (PORCINE) IN NACL 1000-0.9 UT/500ML-% IV SOLN
INTRAVENOUS | Status: AC
  Filled 2018-08-27: qty 1000

## 2018-08-27 MED ORDER — ACETAMINOPHEN 325 MG PO TABS: 650.0000 mg | ORAL_TABLET | ORAL | Status: DC | PRN

## 2018-08-27 MED ORDER — MIDAZOLAM HCL 2 MG/2ML IJ SOLN
INTRAMUSCULAR | Status: AC
  Filled 2018-08-27: qty 2

## 2018-08-27 MED ORDER — VERAPAMIL HCL 2.5 MG/ML IV SOLN
INTRAVENOUS | Status: DC | PRN
  Administered 2018-08-27 (×2): 10 mL via INTRA_ARTERIAL

## 2018-08-27 MED ORDER — SODIUM CHLORIDE 0.9 % WEIGHT BASED INFUSION: 1.0000 mL/kg/h | INTRAVENOUS | Status: DC

## 2018-08-27 MED ORDER — VERAPAMIL HCL 2.5 MG/ML IV SOLN
INTRAVENOUS | Status: AC
  Filled 2018-08-27: qty 2

## 2018-08-27 MED ORDER — SODIUM CHLORIDE 0.9% FLUSH: 3.0000 mL | INTRAVENOUS | Status: DC | PRN

## 2018-08-27 MED ORDER — MIDAZOLAM HCL 2 MG/2ML IJ SOLN
INTRAMUSCULAR | Status: DC | PRN
  Administered 2018-08-27: 2 mg via INTRAVENOUS

## 2018-08-27 MED ORDER — HEPARIN SODIUM (PORCINE) 1000 UNIT/ML IJ SOLN
INTRAMUSCULAR | Status: AC
  Filled 2018-08-27: qty 1

## 2018-08-27 MED ORDER — FENTANYL CITRATE (PF) 100 MCG/2ML IJ SOLN
INTRAMUSCULAR | Status: DC | PRN
  Administered 2018-08-27: 50 ug via INTRAVENOUS

## 2018-08-27 MED ORDER — ASPIRIN 81 MG PO CHEW: 81.0000 mg | CHEWABLE_TABLET | Freq: Every day | ORAL | Status: DC

## 2018-08-27 MED ORDER — SODIUM CHLORIDE 0.9% FLUSH: 3.0000 mL | Freq: Two times a day (BID) | INTRAVENOUS | Status: DC

## 2018-08-27 MED ORDER — FENTANYL CITRATE (PF) 100 MCG/2ML IJ SOLN
INTRAMUSCULAR | Status: AC
  Filled 2018-08-27: qty 2

## 2018-08-27 MED ORDER — IOPAMIDOL (ISOVUE-370) INJECTION 76%
INTRAVENOUS | Status: AC
  Filled 2018-08-27: qty 100

## 2018-08-27 MED ORDER — IOPAMIDOL (ISOVUE-370) INJECTION 76%
INTRAVENOUS | Status: DC | PRN
  Administered 2018-08-27: 40 mL

## 2018-08-27 SURGICAL SUPPLY — 13 items
CATH INFINITI 5FR ANG PIGTAIL (CATHETERS) ×2 IMPLANT
CATH INFINITI JR4 5F (CATHETERS) ×2 IMPLANT
CATH INFINITI MULTIPACK ANG 4F (CATHETERS) ×2 IMPLANT
CATH OPTITORQUE TIG 4.0 5F (CATHETERS) ×2 IMPLANT
DEVICE RAD COMP TR BAND LRG (VASCULAR PRODUCTS) ×2 IMPLANT
GLIDESHEATH SLEND SS 6F .021 (SHEATH) ×2 IMPLANT
GUIDEWIRE INQWIRE 1.5J.035X260 (WIRE) ×1 IMPLANT
INQWIRE 1.5J .035X260CM (WIRE) ×2
KIT HEART LEFT (KITS) ×2 IMPLANT
PACK CARDIAC CATHETERIZATION (CUSTOM PROCEDURE TRAY) ×2 IMPLANT
SYR MEDRAD MARK V 150ML (SYRINGE) IMPLANT
TRANSDUCER W/STOPCOCK (MISCELLANEOUS) ×2 IMPLANT
TUBING CIL FLEX 10 FLL-RA (TUBING) ×2 IMPLANT

## 2018-08-27 NOTE — Interval H&P Note (Signed)
Cath Lab Visit (complete for each Cath Lab visit)  Clinical Evaluation Leading to the Procedure:   ACS: No.  Non-ACS:    Anginal Classification: CCS III  Anti-ischemic medical therapy: Minimal Therapy (1 class of medications)  Non-Invasive Test Results: Intermediate-risk stress test findings: cardiac mortality 1-3%/year  Prior CABG: No previous CABG      History and Physical Interval Note:  08/27/2018 9:01 AM  Terry Conley  has presented today for surgery, with the diagnosis of angina  The various methods of treatment have been discussed with the patient and family. After consideration of risks, benefits and other options for treatment, the patient has consented to  Procedure(s): LEFT HEART CATH AND CORONARY ANGIOGRAPHY (N/A) as a surgical intervention .  The patient's history has been reviewed, patient examined, no change in status, stable for surgery.  I have reviewed the patient's chart and labs.  Questions were answered to the patient's satisfaction.     Shelva Majestic

## 2018-08-27 NOTE — Discharge Instructions (Signed)
Drink plenty of fluids over next 48 hours and keep right wrist elevated at heart level for 24 hours ° °Radial Site Care °Refer to this sheet in the next few weeks. These instructions provide you with information about caring for yourself after your procedure. Your health care provider may also give you more specific instructions. Your treatment has been planned according to current medical practices, but problems sometimes occur. Call your health care provider if you have any problems or questions after your procedure. °What can I expect after the procedure? °After your procedure, it is typical to have the following: °· Bruising at the radial site that usually fades within 1-2 weeks. °· Blood collecting in the tissue (hematoma) that may be painful to the touch. It should usually decrease in size and tenderness within 1-2 weeks. ° °Follow these instructions at home: °· Take medicines only as directed by your health care provider. °· You may shower 24-48 hours after the procedure or as directed by your health care provider. Remove the bandage (dressing) and gently wash the site with plain soap and water. Pat the area dry with a clean towel. Do not rub the site, because this may cause bleeding. °· Do not take baths, swim, or use a hot tub until your health care provider approves. °· Check your insertion site every day for redness, swelling, or drainage. °· Do not apply powder or lotion to the site. °· Do not flex or bend the affected arm for 24 hours or as directed by your health care provider. °· Do not push or pull heavy objects with the affected arm for 24 hours or as directed by your health care provider. °· Do not lift over 10 lb (4.5 kg) for 5 days after your procedure or as directed by your health care provider. °· Ask your health care provider when it is okay to: °? Return to work or school. °? Resume usual physical activities or sports. °? Resume sexual activity. °· Do not drive home if you are discharged the  same day as the procedure. Have someone else drive you. °· You may drive 24 hours after the procedure unless otherwise instructed by your health care provider. °· Do not operate machinery or power tools for 24 hours after the procedure. °· If your procedure was done as an outpatient procedure, which means that you went home the same day as your procedure, a responsible adult should be with you for the first 24 hours after you arrive home. °· Keep all follow-up visits as directed by your health care provider. This is important. °Contact a health care provider if: °· You have a fever. °· You have chills. °· You have increased bleeding from the radial site. Hold pressure on the site. °Get help right away if: °· You have unusual pain at the radial site. °· You have redness, warmth, or swelling at the radial site. °· You have drainage (other than a small amount of blood on the dressing) from the radial site. °· The radial site is bleeding, and the bleeding does not stop after 30 minutes of holding steady pressure on the site. °· Your arm or hand becomes pale, cool, tingly, or numb. °This information is not intended to replace advice given to you by your health care provider. Make sure you discuss any questions you have with your health care provider. °Document Released: 01/17/2011 Document Revised: 05/22/2016 Document Reviewed: 07/03/2014 °Elsevier Interactive Patient Education © 2018 Elsevier Inc. ° °

## 2018-08-31 NOTE — Research (Signed)
CADFEM Informed Consent          Subject Name: Terry Conley     Subject met inclusion and exclusion criteria. The informed consent form, study requirements and expectations were reviewed with the subject and questions and concerns were addressed prior to the signing of the consent form. The subject verbalized understanding of the trial requirements. The subject agreed to participate in the CADFEM trial and signed the informed consent. The informed consent was obtained prior to performance of any protocol-specific procedures for the subject. A copy of the signed consent was given to the subject.      [place] 08/27/2018 08:40

## 2018-09-14 ENCOUNTER — Telehealth: Payer: Self-pay | Admitting: Cardiovascular Disease

## 2018-09-14 NOTE — Telephone Encounter (Signed)
Patient called stating that he had a CTA in August. He states that he received a bill today from  [facility] and his insurance saying that the procedure was not covered. He was under the impression that the procedure was covered before he had it. Patient would like a telephone call.

## 2018-09-22 ENCOUNTER — Encounter: Payer: Self-pay | Admitting: Physician Assistant

## 2018-09-22 NOTE — Progress Notes (Signed)
Cardiology Office Note    Date:  09/23/2018  ID:  Terry Conley, DOB Mar 03, 1956, MRN [id number] PCP:  Caryl Bis, MD  Cardiologist:  Kate Sable, MD  Chief Complaint: f/u cath  History of Present Illness:  Terry Conley is a 62 y.o. male with history of CAD, ED, GERD, HTN, DM, obesity, probable CKD stage II who presents for post-cath follow-up.   Over the last year and a half he has been evaluated for exertional dyspnea. 2D echo 04/2017 showed EF 40-98%, normal diastolic function, normal RV, mild LAE. He was seen by pulmonology and PFTs were normal. It was not clear what was causing his symptoms. The patient states it was suggested he could have some sort of spasm or allergic response. He was seen by Dr. Bronson Ing for these symptoms. He could not undergo traditional treadmill testing due to chronic hip problems. Coronary CT was performed but he is having issues with his insurance covering this. CT was suggestive of disease. He was started on metoprolol - baseline HR was in the 90s-low 100s. Definitive cath 08/27/18 showed 35% ostial-prox LAD, 20% prox-mid LAD, 5% mLAD, 10% OM1, unable to cross LV due to radial spasm.  Otherwise cardiac CT showed mild aortic root dilation 4cm, fatty liver, dependent atelectasis. Last labs 07/2018 Hgb 14.7, Cr 1.27, K 3.9, glucose 183.  He returns for follow-up overall feeling somewhat better. DOE has not completely resolved but has improved on metoprolol as well as addition of OTC Claritin. No LEE, orthopnea, palpitations, syncope or CP. He cut down alcohol significantly and is eating healthier too.   Past Medical History:  Diagnosis Date  . CAD (coronary artery disease)    a. nonobstructive by cath 2019.  . CKD (chronic kidney disease), stage II   . Diabetes ([place])   . Dilated aortic root ([place])    a. 4cm by CT 2019.  . ED (erectile dysfunction)   . Fatty liver    by imaging  . GERD (gastroesophageal reflux disease)   . Hypertension     Past  Surgical History:  Procedure Laterality Date  . APPENDECTOMY  1961  . CATARACT EXTRACTION, BILATERAL  2016  . hammertoe repair Right 2016  . LEFT HEART CATH AND CORONARY ANGIOGRAPHY N/A 08/27/2018   Procedure: LEFT HEART CATH AND CORONARY ANGIOGRAPHY;  Surgeon: Troy Sine, MD;  Location: [facility];  Service: Cardiovascular;  Laterality: N/A;  . NASAL SINUS SURGERY  2000   deviated septum, polyp removal  . SHOULDER SURGERY Left 2007    Current Medications: Current Meds  Medication Sig  . aspirin EC 81 MG tablet Take 81 mg by mouth daily.  Marland Kitchen atorvastatin (LIPITOR) 40 MG tablet Take 1 tablet (40 mg total) by mouth daily.  . indapamide (LOZOL) 2.5 MG tablet Take 2.5 mg by mouth daily.  Marland Kitchen lisinopril (PRINIVIL,ZESTRIL) 20 MG tablet Take 20 mg by mouth daily.  . metoprolol tartrate (LOPRESSOR) 25 MG tablet Take 1 tablet (25 mg total) by mouth 2 (two) times daily.  Marland Kitchen omeprazole (PRILOSEC) 20 MG capsule Take 20 mg by mouth daily.   . tamsulosin (FLOMAX) 0.4 MG CAPS capsule Take 0.4 mg by mouth at bedtime.  Marland Kitchen testosterone cypionate (DEPOTESTOTERONE CYPIONATE) 100 MG/ML injection Inject 100 mg into the muscle every 7 (seven) days.       Allergies:   Codeine   Social History   Socioeconomic History  . Marital status: Married    Spouse name: Not on file  .  Number of children: Not on file  . Years of education: Not on file  . Highest education level: Not on file  Occupational History  . Not on file  Social Needs  . Financial resource strain: Not on file  . Food insecurity:    Worry: Not on file    Inability: Not on file  . Transportation needs:    Medical: Not on file    Non-medical: Not on file  Tobacco Use  . Smoking status: Former Smoker    Packs/day: 0.75    Years: 30.00    Pack years: 22.50    Types: Cigarettes    Last attempt to quit: 10/12/2006    Years since quitting: 11.9  . Smokeless tobacco: Never Used  Substance and Sexual Activity  . Alcohol use:  Yes    Comment: rare   . Drug use: Never  . Sexual activity: Not on file  Lifestyle  . Physical activity:    Days per week: Not on file    Minutes per session: Not on file  . Stress: Not on file  Relationships  . Social connections:    Talks on phone: Not on file    Gets together: Not on file    Attends religious service: Not on file    Active member of club or organization: Not on file    Attends meetings of clubs or organizations: Not on file    Relationship status: Not on file  Other Topics Concern  . Not on file  Social History Narrative  . Not on file     Family History:  The patient's family history includes Breast cancer in his paternal grandmother.  ROS:   Please see the history of present illness.  All other systems are reviewed and otherwise negative.    PHYSICAL EXAM:   VS:  BP 140/90   Pulse 69   Ht 6\' 1"  (1.854 m)   Wt 233 lb (105.7 kg)   SpO2 95%   BMI 30.74 kg/m   BMI: Body mass index is 30.74 kg/m. GEN: Well nourished, well developed WM, in no acute distress HEENT: normocephalic, atraumatic Neck: no JVD, carotid bruits, or masses Cardiac: RRR; no murmurs, rubs, or gallops, trace puffiness of ankles where varicose veins are Respiratory:  clear to auscultation bilaterally, normal work of breathing GI: soft, nontender, nondistended, + BS MS: no deformity or atrophy Skin: warm and dry, no rash. Right radial cath site without hematoma or ecchymosis; good pulse. Neuro:  Alert and Oriented x 3, Strength and sensation are intact, follows commands Psych: euthymic mood, full affect  Wt Readings from Last 3 Encounters:  09/23/18 233 lb (105.7 kg)  08/27/18 232 lb (105.2 kg)  08/23/18 229 lb (103.9 kg)      Studies/Labs Reviewed:   EKG:  EKG was not ordered today.  Recent Labs: 08/23/2018: BUN 12; Creat 1.27; Hemoglobin 14.7; Platelets 312; Potassium 3.9; Sodium 139   Lipid Panel No results found for: CHOL, TRIG, HDL, CHOLHDL, VLDL, LDLCALC,  LDLDIRECT  Additional studies/ records that were reviewed today include: Summarized above.\   ASSESSMENT & PLAN:   1. Dyspnea on exertion - cath only showed nonobstructive disease and prior pulm workup unrevealing. He does report he has been more sedentary due to hip issues (but was waiting on cardiac clearance before seeking treatment for this and increasing activity). Given his HR in the 90s-100s previously, I wonder if perhaps he had inappropriate/exaggerated sinus tachycardia causing his dyspnea. His resting HR has  come down to the 60s with combination of metoprolol and antihistamine. Would continue present regimen and gradually increase activity to build exercise tolerance. It was noted that EKG in 07/2018 showed occasional PVCs but I listened for a full minute and did not appreciate any ectopy. If dyspnea worsens, could consider event monitoring to quantify to exclude as a contributor. Cath was unable to cross LV, so will also order BNP to exclude CHF. 2. CAD - continue risk factor modification. Discussed BP control. Lipids are followed by PCP - would suggest goal LDL of <70 (do not have access to recent lipids). Also discussed continued f/u for his DM as the patient self-discontinued metformin due to internet side effects and anecdotal family friend stories. I encouraged him to reach out to PCP to discuss plan for monitoring/alternatives. 3. Essential HTN - BP up today but he reports if he overexerts himself and does not drink water, it will get down into the 70s - thus no changes made today. The patient was instructed to monitor their blood pressure at home and to call if tending to run higher than 130/80. 4. Aortic root dilation - anticipate yearly surveillance at discretion of primary cardiologist. 5. CKD II - repeat BMET today to ensure post-cath stability.  Disposition: F/u with Dr. Bronson Ing in 6 months.   Medication Adjustments/Labs and Tests Ordered: Current medicines are reviewed at  length with the patient today.  Concerns regarding medicines are outlined above. Medication changes, Labs and Tests ordered today are summarized above and listed in the Patient Instructions accessible in Encounters.   Signed, Charlie Pitter, PA-C  09/23/2018 1:09 PM    [facility] Location in [place] [place], [place] [address] Ph: [phone]; Fax [phone]

## 2018-09-23 ENCOUNTER — Ambulatory Visit: Payer: BLUE CROSS/BLUE SHIELD | Admitting: Physician Assistant

## 2018-09-23 ENCOUNTER — Encounter: Payer: Self-pay | Admitting: Physician Assistant

## 2018-09-23 VITALS — BP 140/90 | HR 69 | Ht 73.0 in | Wt 233.0 lb

## 2018-09-23 DIAGNOSIS — I77810 Thoracic aortic ectasia: Secondary | ICD-10-CM | POA: Diagnosis not present

## 2018-09-23 DIAGNOSIS — I10 Essential (primary) hypertension: Secondary | ICD-10-CM | POA: Diagnosis not present

## 2018-09-23 DIAGNOSIS — R0600 Dyspnea, unspecified: Secondary | ICD-10-CM

## 2018-09-23 DIAGNOSIS — I2510 Atherosclerotic heart disease of native coronary artery without angina pectoris: Secondary | ICD-10-CM

## 2018-09-23 DIAGNOSIS — R0609 Other forms of dyspnea: Secondary | ICD-10-CM

## 2018-09-23 DIAGNOSIS — N182 Chronic kidney disease, stage 2 (mild): Secondary | ICD-10-CM

## 2018-09-23 NOTE — Patient Instructions (Signed)
Medication Instructions:  Your physician recommends that you continue on your current medications as directed. Please refer to the Current Medication list given to you today.   Labwork: Your physician recommends that you return for lab work today.   Testing/Procedures: NONE   Follow-Up: Your physician wants you to follow-up in: 6 MONTHS.  You will receive a reminder letter in the mail two months in advance. If you don't receive a letter, please call our office to schedule the follow-up appointment.   Any Other Special Instructions Will Be Listed Below (If Applicable).  Monitor Blood Pressure at home and call if tending to run greater than 130/80  If you need a refill on your cardiac medications before your next appointment, please call your pharmacy.  Thank you for choosing [facility]!

## 2018-09-24 LAB — BASIC METABOLIC PANEL
BUN/Creatinine Ratio: 9 (calc) (ref 6–22)
BUN: 12 mg/dL (ref 7–25)
CALCIUM: 9.6 mg/dL (ref 8.6–10.3)
CHLORIDE: 104 mmol/L (ref 98–110)
CO2: 30 mmol/L (ref 20–32)
Creat: 1.31 mg/dL — ABNORMAL HIGH (ref 0.70–1.25)
Glucose, Bld: 96 mg/dL (ref 65–139)
POTASSIUM: 4.8 mmol/L (ref 3.5–5.3)
Sodium: 141 mmol/L (ref 135–146)

## 2018-09-24 LAB — BRAIN NATRIURETIC PEPTIDE: Brain Natriuretic Peptide: 64 pg/mL (ref <100)

## 2018-10-12 ENCOUNTER — Ambulatory Visit: Payer: BLUE CROSS/BLUE SHIELD | Admitting: Student

## 2018-10-14 ENCOUNTER — Ambulatory Visit: Payer: BLUE CROSS/BLUE SHIELD | Admitting: Student

## 2019-02-24 ENCOUNTER — Other Ambulatory Visit: Payer: Self-pay | Admitting: Cardiovascular Disease

## 2020-04-11 NOTE — Progress Notes (Deleted)
Duplicate

## 2020-04-12 ENCOUNTER — Encounter: Payer: Self-pay | Admitting: Student

## 2020-04-12 ENCOUNTER — Other Ambulatory Visit: Payer: Self-pay

## 2020-04-12 ENCOUNTER — Ambulatory Visit: Payer: BLUE CROSS/BLUE SHIELD | Admitting: Student

## 2020-04-12 VITALS — BP 134/86 | HR 114 | Ht 73.0 in | Wt 227.0 lb

## 2020-04-12 DIAGNOSIS — I10 Essential (primary) hypertension: Secondary | ICD-10-CM

## 2020-04-12 DIAGNOSIS — I2510 Atherosclerotic heart disease of native coronary artery without angina pectoris: Secondary | ICD-10-CM | POA: Diagnosis not present

## 2020-04-12 DIAGNOSIS — Z01810 Encounter for preprocedural cardiovascular examination: Secondary | ICD-10-CM | POA: Diagnosis not present

## 2020-04-12 DIAGNOSIS — R000 Tachycardia, unspecified: Secondary | ICD-10-CM

## 2020-04-12 NOTE — Progress Notes (Signed)
Cardiology Office Note    Date:  04/12/2020   ID:  Antolin Vannorman, DOB Mar 10, 1956, MRN [id number]  PCP:  Caryl Bis, MD  Cardiologist: Kate Sable, MD    Chief Complaint  Patient presents with  . Follow-up    Preoperative cardiac clearance    History of Present Illness:    Terry Conley is a 64 y.o. male with past medical history of CAD (s/p cath in 07/2018 showing mild-nonobstructive CAD), HTN, HLD, Type 2 DM and Stage 3 CKD who presents to the office today for cardiac clearance in regards to upcoming hip replacement.  He was last examined by Melina Copa, PA-C in 08/2018 following his recent catheterization which showed mild nonobstructive CAD as outlined below. He was still having intermittent episodes of dyspnea on exertion and it was thought he might be having inappropriate sinus tachycardia as his heart rate was in the 90's to 110's at times but had improved to the 60's with Lopressor. He was continued on his current medication regimen including ASA 81 mg daily, Atorvastatin 40 mg daily, Indapamide 2.5mg  daily, Lisinopril 20mg  daily and Lopressor 25mg  BID. Was informed to follow-up in 6 months but has not been evaluated by Cardiology since.   In talking with the patient today, he reports overall doing well from a cardiac perspective since his last visit. He denies any recent chest pain or dyspnea on exertion. He reports his prior dyspnea was occurring after consuming red meat and he was diagnosed with Alpha-gal syndrome. Since giving up red meat, his dyspnea significantly improved. He denies any recent orthopnea or PND. HR was initially elevated in the 110's today but improved into the 90's on examination. He denies any recent palpitations and reports HR is typically in the 80's to low-100's when checked at home. He discontinued Lopressor over a year ago due to this causing coldness along his upper extremities and symptoms resolved with discontinuation of the medication.   He  does not exercise regularly due to his significant hip pain but helps take care of his grandchildren and walks around the grocery store without any anginal symptoms.   Past Medical History:  Diagnosis Date  . CAD (coronary artery disease)    a. nonobstructive by cath 2019.  . CKD (chronic kidney disease), stage II   . Diabetes ([facility])   . Dilated aortic root ([place])    a. 4cm by CT 2019.  . ED (erectile dysfunction)   . Fatty liver    by imaging  . GERD (gastroesophageal reflux disease)   . Hypertension     Past Surgical History:  Procedure Laterality Date  . APPENDECTOMY  1961  . CATARACT EXTRACTION, BILATERAL  2016  . hammertoe repair Right 2016  . LEFT HEART CATH AND CORONARY ANGIOGRAPHY N/A 08/27/2018   Procedure: LEFT HEART CATH AND CORONARY ANGIOGRAPHY;  Surgeon: Troy Sine, MD;  Location: [facility];  Service: Cardiovascular;  Laterality: N/A;  . NASAL SINUS SURGERY  2000   deviated septum, polyp removal  . SHOULDER SURGERY Left 2007    Current Medications: Outpatient Medications Prior to Visit  Medication Sig Dispense Refill  . Ascorbic Acid (VITAMIN C WITH ROSE HIPS) 500 MG tablet Take 500 mg by mouth daily.    Marland Kitchen atorvastatin (LIPITOR) 40 MG tablet Take 1 tablet (40 mg total) by mouth daily. 30 tablet 6  . Ferrous Sulfate (IRON) 325 (65 Fe) MG TABS Take by mouth.    . fexofenadine (ALLEGRA) 180 MG tablet  Take 180 mg by mouth daily.    . indapamide (LOZOL) 2.5 MG tablet Take 2.5 mg by mouth daily.    Marland Kitchen lisinopril (PRINIVIL,ZESTRIL) 20 MG tablet Take 20 mg by mouth daily.    . metFORMIN (GLUCOPHAGE) 500 MG tablet Take by mouth 2 (two) times daily with a meal.    . omeprazole (PRILOSEC) 20 MG capsule Take 20 mg by mouth daily.     Marland Kitchen testosterone cypionate (DEPOTESTOTERONE CYPIONATE) 100 MG/ML injection Inject 100 mg into the muscle every 7 (seven) days.     . vitamin B-12 (CYANOCOBALAMIN) 500 MCG tablet Take 500 mcg by mouth daily.    . metoprolol tartrate  (LOPRESSOR) 25 MG tablet TAKE 1 TABLET BY MOUTH TWICE A DAY 180 tablet 2  . tamsulosin (FLOMAX) 0.4 MG CAPS capsule Take 0.4 mg by mouth at bedtime.    Marland Kitchen aspirin EC 81 MG tablet Take 81 mg by mouth daily.     No facility-administered medications prior to visit.     Allergies:   Codeine   Social History   Socioeconomic History  . Marital status: Married    Spouse name: Not on file  . Number of children: Not on file  . Years of education: Not on file  . Highest education level: Not on file  Occupational History  . Not on file  Tobacco Use  . Smoking status: Former Smoker    Packs/day: 0.75    Years: 30.00    Pack years: 22.50    Types: Cigarettes    Quit date: 10/12/2006    Years since quitting: 13.5  . Smokeless tobacco: Never Used  Substance and Sexual Activity  . Alcohol use: Yes    Comment: rare   . Drug use: Never  . Sexual activity: Not on file  Other Topics Concern  . Not on file  Social History Narrative  . Not on file   Social Determinants of Health   Financial Resource Strain:   . Difficulty of Paying Living Expenses:   Food Insecurity:   . Worried About Charity fundraiser in the Last Year:   . Arboriculturist in the Last Year:   Transportation Needs:   . Film/video editor (Medical):   Marland Kitchen Lack of Transportation (Non-Medical):   Physical Activity:   . Days of Exercise per Week:   . Minutes of Exercise per Session:   Stress:   . Feeling of Stress :   Social Connections:   . Frequency of Communication with Friends and Family:   . Frequency of Social Gatherings with Friends and Family:   . Attends Religious Services:   . Active Member of Clubs or Organizations:   . Attends Archivist Meetings:   Marland Kitchen Marital Status:      Family History:  The patient's family history includes Breast cancer in his paternal grandmother; Hypertension in his father and mother.   Review of Systems:   Please see the history of present illness.     General:   No chills, fever, night sweats or weight changes. Positive for hip pain.  Cardiovascular:  No chest pain, dyspnea on exertion, edema, orthopnea, palpitations, paroxysmal nocturnal dyspnea. Dermatological: No rash, lesions/masses Respiratory: No cough, dyspnea Urologic: No hematuria, dysuria Abdominal:   No nausea, vomiting, diarrhea, bright red blood per rectum, melena, or hematemesis Neurologic:  No visual changes, wkns, changes in mental status. All other systems reviewed and are otherwise negative except as noted above.   Physical  Exam:    VS:  BP 134/86   Pulse (!) 114   Ht 6\' 1"  (1.854 m)   Wt 227 lb (103 kg)   SpO2 96%   BMI 29.95 kg/m    General: Well developed, well nourished,male appearing in no acute distress. Head: Normocephalic, atraumatic, sclera non-icteric.  Neck: No carotid bruits. JVD not elevated.  Lungs: Respirations regular and unlabored, without wheezes or rales.  Heart: Regular rate and rhythm. No S3 or S4.  No murmur, no rubs, or gallops appreciated. Abdomen: Soft, non-tender, non-distended. No obvious abdominal masses. Msk:  Strength and tone appear normal for age. No obvious joint deformities or effusions. Extremities: No clubbing or cyanosis. Trace ankle edema bilaterally.  Distal pedal pulses are 2+ bilaterally. Neuro: Alert and oriented X 3. Moves all extremities spontaneously. No focal deficits noted. Psych:  Responds to questions appropriately with a normal affect. Skin: No rashes or lesions noted  Wt Readings from Last 3 Encounters:  04/12/20 227 lb (103 kg)  09/23/18 233 lb (105.7 kg)  08/27/18 232 lb (105.2 kg)     Studies/Labs Reviewed:   EKG:  EKG is ordered today.  The ekg ordered today demonstrates sinus tachycardia, HR 111 with PAC's. Inferior infarct pattern which is similar to prior tracings. No acute changes.   Recent Labs: No results found for requested labs within last 8760 hours.   Lipid Panel No results found for: CHOL, TRIG,  HDL, CHOLHDL, VLDL, LDLCALC, LDLDIRECT  Additional studies/ records that were reviewed today include:   Cardiac Catheterization: 07/2018  Ost LAD to Prox LAD lesion is 35% stenosed.  Prox LAD to Mid LAD lesion is 20% stenosed.  Mid LAD lesion is 5% stenosed.  Ost 1st Mrg lesion is 10% stenosed.   Mild nonobstructive CAD with evidence for 30%-40% plaque in the proximal LAD, 20% mild calcification of the mid LAD followed by a more calcified segment with narrowing of less than 5%.  There was normal flow distally in the LAD wrapped around the apex.  The circumflex vessel was a codominant vessel.  There was minimal less than 10% plaque in the OM1 vessel.  The remainder of the vessel was free of significant disease.  The RCA was a codominant vessel and appeared normal.  Attempt at crossing into the left ventricle was unsuccessful due to radial artery spasm.  RECOMMENDATION: High potency statin therapy and attempt to induce plaque regression.  Medical therapy for CAD. Recommend Aspirin 81mg  daily for moderate CAD.  Assessment:    1. Preoperative cardiovascular examination   2. Coronary artery disease involving native coronary artery of native heart without angina pectoris   3. Sinus tachycardia   4. Essential hypertension      Plan:   In order of problems listed above:  1. Preoperative Cardiac Clearance for Hip Replacement - he has a history of mild-nonobstructive CAD by cath in 07/2018 and denies any recent anginal symptoms. He has never required stent placement and does not have a history of CHF. While activity has been limited due to his hip pain, he is able to perform 4 METS of activity without cardiac symptoms.  - EKG today shows sinus tachycardia with known ST abnormalities along the inferior leads which is similar to prior tracings. His RCRI Risk is low at 0.4% risk of a major cardiac event. He is of acceptable risk to proceed without the indication for further cardiac testing  at this time. Will forward today's note to Dr. Wynelle Link. The patient is  aware he can hold ASA 5-7 days prior to his upcoming surgery.  2. CAD - he is s/p cath in 07/2018 which showed mild-nonobstructive CAD as outlined above. Continue ASA and Atorvastatin 40mg  daily with goal LDL less than 70.   3. Sinus tachycardia - HR was initially elevated in the 110's today but improved into the 90's on examination. He denies any recent palpitations and reports HR is typically in the 80's to low-100's when checked at home. Intolerant to Lopressor in the past. Given his asymptomatic state, will not reinitiate BB therapy at this time. We reviewed that if he did develop symptoms, would try Cardizem or a different BB. Will request most recent labs from his PCP.   4. HTN - BP is well-controlled at 134/86 during today's visit. Continue Lisinopril 20mg  daily and Lozol 2.5mg  daily.   He wishes to follow-up with Cardiology on a PRN basis.      Medication Adjustments/Labs and Tests Ordered: Current medicines are reviewed at length with the patient today.  Concerns regarding medicines are outlined above.  Medication changes, Labs and Tests ordered today are listed in the Patient Instructions below. Patient Instructions  Medication Instructions:  Your physician recommends that you continue on your current medications as directed. Please refer to the Current Medication list given to you today.   Labwork: NONE  Testing/Procedures: NONE  Follow-Up: Your physician recommends that you schedule a follow-up appointment in: AS NEEDED    Any Other Special Instructions Will Be Listed Below (If Applicable).  YOU ARE CLEARED FOR SURGERY   PLEASE HOLD ASPIRIN 5 DAYS PRIOR TO SURGERY    If you need a refill on your cardiac medications before your next appointment, please call your pharmacy.      Signed, Erma Heritage, PA-C  04/12/2020 7:29 PM    [facility] [address]  [place], [place] [address] Phone: [phone] Fax: [phone]

## 2020-04-12 NOTE — Patient Instructions (Signed)
Medication Instructions:  Your physician recommends that you continue on your current medications as directed. Please refer to the Current Medication list given to you today.   Labwork: NONE  Testing/Procedures: NONE  Follow-Up: Your physician recommends that you schedule a follow-up appointment in: AS NEEDED    Any Other Special Instructions Will Be Listed Below (If Applicable).  YOU ARE CLEARED FOR SURGERY   PLEASE HOLD ASPIRIN 5 DAYS PRIOR TO SURGERY    If you need a refill on your cardiac medications before your next appointment, please call your pharmacy.

## 2020-05-22 ENCOUNTER — Ambulatory Visit: Payer: BLUE CROSS/BLUE SHIELD | Admitting: Cardiovascular Disease

## 2021-07-21 NOTE — Progress Notes (Signed)
H&P  Chief Complaint: Multiple urologic complaints  History of Present Illness: 65 year old male presenting, referred by Dr. Kern Alberta for evaluation of multiple issues.  1.  Erectile dysfunction.  He has been on testosterone treatment for his low T levels for quite some time.  Apparently, testosterone did help his erections initially, but now he is unable to obtain or maintain erections.  He has been on daily Cialis which helps his lower urinary tract symptoms but no longer helps his ED.  He is intolerant of sildenafil.  2.  BPH with lower urinary tract symptoms.  He does have frequency, urgency, slow stream, feeling of incomplete emptying.  He is on tamsulosin.  IPSS 29, quality-of-life score 6.  He does have significant nocturia as well.  He does have indapamide to take in the morning which he usually does not take.  He does have lower extremity edema.  3.  Elevated PSA.  Apparently, patient's PSA usually was in the 3 range.  He states that back in the spring of this year it was 6.  He has not seen a urologist before  4.  Low T.  He is on 200 mg of testosterone cypionate every 2 weeks.  He typically injects on Tuesdays.  He is due injection today.  Past Medical History:  Diagnosis Date   CAD (coronary artery disease)    a. nonobstructive by cath 2019.   CKD (chronic kidney disease), stage II    Diabetes ([facility])    Dilated aortic root ([facility])    a. 4cm by CT 2019.   ED (erectile dysfunction)    Fatty liver    by imaging   GERD (gastroesophageal reflux disease)    Hypertension     Past Surgical History:  Procedure Laterality Date   APPENDECTOMY  1961   CATARACT EXTRACTION, BILATERAL  2016   hammertoe repair Right 2016   LEFT HEART CATH AND CORONARY ANGIOGRAPHY N/A 08/27/2018   Procedure: LEFT HEART CATH AND CORONARY ANGIOGRAPHY;  Surgeon: Troy Sine, MD;  Location: [facility];  Service: Cardiovascular;  Laterality: N/A;   NASAL SINUS SURGERY  2000   deviated septum,  polyp removal   SHOULDER SURGERY Left 2007    Home Medications:  Allergies as of 07/23/2021       Reactions   Codeine Nausea Only        Medication List        Accurate as of July 21, 2021 12:57 PM. If you have any questions, ask your nurse or doctor.          aspirin EC 81 MG tablet Take 81 mg by mouth daily.   atorvastatin 40 MG tablet Commonly known as: LIPITOR Take 1 tablet (40 mg total) by mouth daily.   fexofenadine 180 MG tablet Commonly known as: ALLEGRA Take 180 mg by mouth daily.   indapamide 2.5 MG tablet Commonly known as: LOZOL Take 2.5 mg by mouth daily.   Iron 325 (65 Fe) MG Tabs Take by mouth.   lisinopril 20 MG tablet Commonly known as: ZESTRIL Take 20 mg by mouth daily.   metFORMIN 500 MG tablet Commonly known as: GLUCOPHAGE Take by mouth 2 (two) times daily with a meal.   omeprazole 20 MG capsule Commonly known as: PRILOSEC Take 20 mg by mouth daily.   testosterone cypionate 100 MG/ML injection Commonly known as: DEPOTESTOTERONE CYPIONATE Inject 100 mg into the muscle every 7 (seven) days.   vitamin B-12 500 MCG tablet Commonly known  as: CYANOCOBALAMIN Take 500 mcg by mouth daily.   vitamin C with rose hips 500 MG tablet Take 500 mg by mouth daily.        Allergies:  Allergies  Allergen Reactions   Codeine Nausea Only    Family History  Problem Relation Age of Onset   Breast cancer Paternal Grandmother    Hypertension Mother    Hypertension Father     Social History:  reports that he quit smoking about 14 years ago. His smoking use included cigarettes. He has a 22.50 pack-year smoking history. He has never used smokeless tobacco. He reports current alcohol use. He reports that he does not use drugs.  ROS: A complete review of systems was performed.  All systems are negative except for pertinent findings as noted.  Physical Exam:  Vital signs in last 24 hours: There were no vitals taken for this  visit. Constitutional:  Alert and oriented, No acute distress Cardiovascular: Regular rate.  He has 3+ bilateral pretibial edema to the knees. Respiratory: Normal respiratory effort GI: Abdomen is soft, nontender, nondistended, no abdominal masses. No CVAT.  No inguinal hernias Genitourinary: Normal male phallus, testes are descended bilaterally and non-tender and without masses, scrotum is normal in appearance without lesions or masses, perineum is normal on inspection.  Prostate 80 g, symmetrical, nonnodular nontender. Lymphatic: No lymphadenopathy Neurologic: Grossly intact, no focal deficits Psychiatric: Normal mood and affect  I have reviewed prior pt notes  I have reviewed notes from referring/previous physicians  I have reviewed urinalysis results    Impression/Assessment:  1.  ED, thus far, not well treated with PDE 5 inhibitors which are also not well tolerated  2.  Hypogonadism, on testosterone injections.  3.  BPH with lower urinary tract symptoms, severe.  He does empty well.  He is on tamsulosin, 1 dose today  4.  Nocturia, bothersome, multifactorial.  5.  History of elevated PSA, he does have a large but benign feeling gland  Plan:  1.  We will check testosterone (trough levels) as well as PSA today  2.  I gave him a prescription for alprostadil 10 mcg/mL, he will bring into his next visit  3.  Double up on tamsulosin, 1 every 12 hours  4.  Nocturia guidelines given-limit evening fluids, sodium, take his indapamide every morning, consider compression socks  5.  I will see back in about 4 weeks to check his symptoms, as well as to teach administration of alprostadil

## 2021-07-23 ENCOUNTER — Ambulatory Visit (INDEPENDENT_AMBULATORY_CARE_PROVIDER_SITE_OTHER): Payer: Medicare Other | Admitting: Urology

## 2021-07-23 ENCOUNTER — Other Ambulatory Visit: Payer: Self-pay

## 2021-07-23 ENCOUNTER — Encounter: Payer: Self-pay | Admitting: Urology

## 2021-07-23 VITALS — BP 142/100 | HR 99

## 2021-07-23 DIAGNOSIS — R972 Elevated prostate specific antigen [PSA]: Secondary | ICD-10-CM | POA: Diagnosis not present

## 2021-07-23 DIAGNOSIS — N138 Other obstructive and reflux uropathy: Secondary | ICD-10-CM

## 2021-07-23 DIAGNOSIS — N5201 Erectile dysfunction due to arterial insufficiency: Secondary | ICD-10-CM | POA: Diagnosis not present

## 2021-07-23 DIAGNOSIS — N401 Enlarged prostate with lower urinary tract symptoms: Secondary | ICD-10-CM | POA: Diagnosis not present

## 2021-07-23 DIAGNOSIS — R7989 Other specified abnormal findings of blood chemistry: Secondary | ICD-10-CM

## 2021-07-23 DIAGNOSIS — R32 Unspecified urinary incontinence: Secondary | ICD-10-CM | POA: Diagnosis not present

## 2021-07-23 LAB — URINALYSIS, ROUTINE W REFLEX MICROSCOPIC
Bilirubin, UA: NEGATIVE
Glucose, UA: NEGATIVE
Ketones, UA: NEGATIVE
Leukocytes,UA: NEGATIVE
Nitrite, UA: NEGATIVE
Protein,UA: NEGATIVE
RBC, UA: NEGATIVE
Specific Gravity, UA: 1.02 (ref 1.005–1.030)
Urobilinogen, Ur: 0.2 mg/dL (ref 0.2–1.0)
pH, UA: 5.5 (ref 5.0–7.5)

## 2021-07-23 LAB — BLADDER SCAN AMB NON-IMAGING: Scan Result: 62

## 2021-07-23 NOTE — Progress Notes (Signed)
Urological Symptom Review  PVR-  62   Patient is experiencing the following symptoms: Frequent urination Hard to postpone urination Burning/pain with urination Get up at night to urinate Leakage of urine Stream starts and stops Trouble starting stream Urinary tract infection Erection problems (male only)   Review of Systems  Gastrointestinal (upper)  : Indigestion/heartburn  Gastrointestinal (lower) : Constipation  Constitutional : Night Sweats Fatigue  Skin: Negative for skin symptoms  Eyes: Negative for eye symptoms  Ear/Nose/Throat : Sinus problems  Hematologic/Lymphatic: Negative for Hematologic/Lymphatic symptoms  Cardiovascular : Leg swelling  Respiratory : Shortness of breath  Endocrine: Excessive thirst  Musculoskeletal: Back pain  Neurological: Dizziness  Psychologic: Depression Anxiety

## 2021-07-24 LAB — PSA: Prostate Specific Ag, Serum: 14.2 ng/mL — ABNORMAL HIGH (ref 0.0–4.0)

## 2021-07-24 LAB — TESTOSTERONE: Testosterone: 374 ng/dL (ref 264–916)

## 2021-07-30 ENCOUNTER — Telehealth: Payer: Self-pay

## 2021-07-30 NOTE — Telephone Encounter (Signed)
Patient states he has call several times wanting to know the next step due to his elevated PSA. Please advise.

## 2021-08-07 ENCOUNTER — Other Ambulatory Visit: Payer: Self-pay

## 2021-08-09 NOTE — Progress Notes (Signed)
Results sent via my chart 

## 2021-08-12 ENCOUNTER — Other Ambulatory Visit: Payer: Self-pay | Admitting: Urology

## 2021-08-12 DIAGNOSIS — R972 Elevated prostate specific antigen [PSA]: Secondary | ICD-10-CM

## 2021-08-12 MED ORDER — LEVOFLOXACIN 750 MG PO TABS: 750.0000 mg | ORAL_TABLET | Freq: Every day | ORAL | 0 refills | Status: AC

## 2021-08-12 NOTE — Telephone Encounter (Signed)
I called and talked with patient- patient last logged in mychart 08/09/2021  Results sent to patient via my chart 08/09/2021  Patient states he has not logged back in to see letter that was sent. Pt wishes to have records sent to him and he cancelled his next office visit appt with MD. Patient reports his lab work was completed on 07/23/2021 and it was too long for MD to review to notify patient of his results.

## 2021-08-20 ENCOUNTER — Ambulatory Visit: Payer: BC Managed Care – PPO | Admitting: Urology

## 2021-08-28 ENCOUNTER — Encounter: Payer: Self-pay | Admitting: Neurology

## 2021-11-15 NOTE — Progress Notes (Signed)
NEUROLOGY CONSULTATION NOTE  Terry Conley MRN: [id number] DOB: 27-Nov-1956  Referring provider: Gar Ponto, MD Primary care provider: Gar Ponto, MD  Reason for consult:  balance disorder, vertigo  Assessment/Plan:   Unsteady gait Weakness - proximal  At this time, I would first evaluate for underlying cervical myelopathy.  If unremarkable, then I would focus of a myopathy or NMJ disorder.  At this time, I do not suspect a neurodegenerative disease. 3.  Benign paroxysmal positional vertigo, resolved  MRI of cervical spine without contrast Check serum sed rate, CK, aldolase, TSH, Myasthenia gravis panel with MuSK reflex. If MRI unremarkable, then would pursue NCV-EMG Further recommendations pending results.  Follow up afterwards.   Subjective:  Terry Conley is a 65 year old male with CAD, CKD, DM II, and HTN who presents for balance disorder and vertigo.  History supplemented by referring provider's note.  In 2021, he had left hip replacement.  Since then, he reports some balance problems.  In March, he had episode of vertigo with worsening balance.  He had an MRI of the brain without contrast on 03/07/2021 which showed generalized volume loss but no acute intracranial abnormality.  He once used a scopolamine patch, but had to take it off because it made him confused.  He went to PT for balance.  Vertigo resolved but he started noticing worsening balance problems.  When walking, he feels himself veering to either side.  Sometimes he has to consciously keep his upper body upright or he will be unable to stop from walking.  He also reports weakness in his extremities.  He sometimes has tremor in the hand when he is eating or drinking from a cup.  When he stands up from a chair, he has to push up with his arms.  Once he is standing upright, he feels like he may fall backwards. Denies double vision, dysphagia and shortness of breath.  Denies numbness and weakness.  He has some urinary  incontinence possibly related to prostate cancer.   PAST MEDICAL HISTORY: Past Medical History:  Diagnosis Date   CAD (coronary artery disease)    a. nonobstructive by cath 2019.   CKD (chronic kidney disease), stage II    Diabetes ([place])    Dilated aortic root ([place])    a. 4cm by CT 2019.   ED (erectile dysfunction)    Fatty liver    by imaging   GERD (gastroesophageal reflux disease)    Hypertension     PAST SURGICAL HISTORY: Past Surgical History:  Procedure Laterality Date   APPENDECTOMY  1961   CATARACT EXTRACTION, BILATERAL  2016   hammertoe repair Right 2016   LEFT HEART CATH AND CORONARY ANGIOGRAPHY N/A 08/27/2018   Procedure: LEFT HEART CATH AND CORONARY ANGIOGRAPHY;  Surgeon: Troy Sine, MD;  Location: [facility];  Service: Cardiovascular;  Laterality: N/A;   NASAL SINUS SURGERY  2000   deviated septum, polyp removal   SHOULDER SURGERY Left 2007    MEDICATIONS: Current Outpatient Medications on File Prior to Visit  Medication Sig Dispense Refill   Ascorbic Acid (VITAMIN C WITH ROSE HIPS) 500 MG tablet Take 500 mg by mouth daily.     aspirin EC 81 MG tablet Take 81 mg by mouth daily.     atorvastatin (LIPITOR) 40 MG tablet Take 1 tablet (40 mg total) by mouth daily. 30 tablet 6   Ferrous Sulfate (IRON) 325 (65 Fe) MG TABS Take by mouth.     fexofenadine (ALLEGRA)  180 MG tablet Take 180 mg by mouth daily.     indapamide (LOZOL) 2.5 MG tablet Take 2.5 mg by mouth daily.     lisinopril (PRINIVIL,ZESTRIL) 20 MG tablet Take 20 mg by mouth daily.     metFORMIN (GLUCOPHAGE) 500 MG tablet Take by mouth 2 (two) times daily with a meal.     omeprazole (PRILOSEC) 20 MG capsule Take 20 mg by mouth daily.      tamsulosin (FLOMAX) 0.4 MG CAPS capsule Take 0.4 mg by mouth.     testosterone cypionate (DEPOTESTOTERONE CYPIONATE) 100 MG/ML injection Inject 100 mg into the muscle every 7 (seven) days.      vitamin B-12 (CYANOCOBALAMIN) 500 MCG tablet Take 500 mcg by  mouth daily.     No current facility-administered medications on file prior to visit.    ALLERGIES: Allergies  Allergen Reactions   Amlodipine    Codeine Nausea Only   Ezetimibe    Metformin    Statins    Sulfa Antibiotics     FAMILY HISTORY: Family History  Problem Relation Age of Onset   Breast cancer Paternal Grandmother    Hypertension Mother    Hypertension Father     Objective:  Blood pressure (!) 184/104, pulse 91, height 6\' 1"  (1.854 m), weight 235 lb 12.8 oz (107 kg), SpO2 94 %. General: No acute distress.  Patient appears well-groomed.   Head:  Normocephalic/atraumatic Eyes:  fundi examined but not visualized Neck: supple, no paraspinal tenderness, full range of motion Back: No paraspinal tenderness Heart: regular rate and rhythm Lungs: Clear to auscultation bilaterally. Vascular: No carotid bruits. Neurological Exam: Mental status: alert and oriented to person, place, and time, recent and remote memory intact, fund of knowledge intact, attention and concentration intact, speech fluent and not dysarthric, language intact. Cranial nerves: CN I: not tested CN II: pupils equal, round and reactive to light, visual fields intact CN III, IV, VI:  full range of motion, no nystagmus, no ptosis CN V: facial sensation intact. CN VII: upper and lower face symmetric CN VIII: hearing intact CN IX, X: gag intact, uvula midline CN XI: sternocleidomastoid and trapezius muscles intact CN XII: tongue midline Bulk & Tone: normal, no fasciculations. Motor:  muscle strength 4/5 bilateral deltoids and triceps, 5-/5 left hip flexion and knee extension, otherwise  5/5 throughout Sensation:  Pinprick, temperature and vibratory sensation intact. Deep Tendon Reflexes:  2+ throughout,  toes downgoing.   Finger to nose testing:  Without dysmetria.   Heel to shin:  Without dysmetria.   Gait:  Mildly broad-based cautious gait.  Able to turn. Unable to tandem walk.  Romberg with  sway.    Thank you for allowing me to take part in the care of this patient.  Metta Clines, DO  CC: Terry Ponto, MD

## 2021-11-18 ENCOUNTER — Other Ambulatory Visit (INDEPENDENT_AMBULATORY_CARE_PROVIDER_SITE_OTHER): Payer: Medicare Other

## 2021-11-18 ENCOUNTER — Other Ambulatory Visit: Payer: Self-pay

## 2021-11-18 ENCOUNTER — Ambulatory Visit (INDEPENDENT_AMBULATORY_CARE_PROVIDER_SITE_OTHER): Payer: Medicare Other | Admitting: Neurology

## 2021-11-18 ENCOUNTER — Encounter: Payer: Self-pay | Admitting: Neurology

## 2021-11-18 VITALS — BP 184/104 | HR 91 | Ht 73.0 in | Wt 235.8 lb

## 2021-11-18 DIAGNOSIS — R6889 Other general symptoms and signs: Secondary | ICD-10-CM

## 2021-11-18 DIAGNOSIS — R2681 Unsteadiness on feet: Secondary | ICD-10-CM

## 2021-11-18 DIAGNOSIS — R29898 Other symptoms and signs involving the musculoskeletal system: Secondary | ICD-10-CM

## 2021-11-18 DIAGNOSIS — H8110 Benign paroxysmal vertigo, unspecified ear: Secondary | ICD-10-CM

## 2021-11-18 DIAGNOSIS — G959 Disease of spinal cord, unspecified: Secondary | ICD-10-CM | POA: Diagnosis not present

## 2021-11-18 LAB — CK: Total CK: 75 U/L (ref 7–232)

## 2021-11-18 LAB — TSH: TSH: 2.23 u[IU]/mL (ref 0.35–5.50)

## 2021-11-18 LAB — SEDIMENTATION RATE: Sed Rate: 13 mm/hr (ref 0–20)

## 2021-11-18 NOTE — Patient Instructions (Signed)
Check MRI of cervical spine without contrast Check labs:  Sed Rate, CK, aldolase, TSH, Myasthenia gravis panel with MuSK reflex Further recommendations pending results.

## 2021-11-19 LAB — ALDOLASE: Aldolase: 5.4 U/L (ref <=8.1)

## 2021-11-20 ENCOUNTER — Telehealth: Payer: Self-pay | Admitting: Neurology

## 2021-11-20 NOTE — Telephone Encounter (Signed)
Called patient and delivered results

## 2021-11-20 NOTE — Telephone Encounter (Signed)
Pt called in returning a call about results 

## 2021-11-25 ENCOUNTER — Telehealth: Payer: Self-pay | Admitting: Neurology

## 2021-11-25 NOTE — Telephone Encounter (Signed)
Patient had labs drawn the day of his visit.  Results are in and advised DR.Tomi Likens stated that lab look good.

## 2021-11-25 NOTE — Telephone Encounter (Signed)
Patient called and said the [facility] called him today.  He was called today and asked for his insurance information to schedule an appointment.  Patient wants to be sure this is legitimate before he gives out his information.

## 2021-12-17 ENCOUNTER — Ambulatory Visit
Admission: RE | Admit: 2021-12-17 | Discharge: 2021-12-17 | Disposition: A | Discharge status: Home / Self Care | Payer: Medicare Other | Source: Ambulatory Visit | Attending: Neurology | Admitting: Neurology

## 2021-12-17 ENCOUNTER — Other Ambulatory Visit: Payer: Self-pay

## 2021-12-17 DIAGNOSIS — G959 Disease of spinal cord, unspecified: Secondary | ICD-10-CM

## 2021-12-18 ENCOUNTER — Telehealth: Payer: Self-pay

## 2021-12-18 DIAGNOSIS — R29898 Other symptoms and signs involving the musculoskeletal system: Secondary | ICD-10-CM

## 2021-12-18 DIAGNOSIS — G959 Disease of spinal cord, unspecified: Secondary | ICD-10-CM

## 2021-12-18 NOTE — Telephone Encounter (Signed)
-----   Message from Alda Berthold, DO sent at 12/17/2021  3:56 PM EST ----- Please let pt know that there is narrowing in his cervical spine causing nerve impingement whichis most likely causing his weakness. I recommend that he start neck PT and we can also get the opinion of neurosurgery. Please send referral, if patient agreeable.

## 2021-12-18 NOTE — Telephone Encounter (Signed)
Patient agreed to referrals for Neck PT, Neurosurgeon referral

## 2021-12-31 ENCOUNTER — Other Ambulatory Visit: Payer: Self-pay | Admitting: Neurosurgery

## 2022-01-02 NOTE — Pre-Procedure Instructions (Signed)
Surgical Instructions    Your procedure is scheduled on Monday, January 9th.  Report to [facility] Main Entrance "A" at 08:00 A.M., then check in with the Admitting office.  Call this number if you have problems the morning of surgery:  [phone]   If you have any questions prior to your surgery date call [phone]: Open Monday-Friday 8am-4pm    Remember:  Do not eat after midnight the night before your surgery  You may drink clear liquids until 07:00 AM the morning of your surgery.   Clear liquids allowed are: Water, Non-Citrus Juices (without pulp), Carbonated Beverages, Clear Tea, Black Coffee Only, and Gatorade    Take these medicines the morning of surgery with A SIP OF WATER  fexofenadine (ALLEGRA) finasteride (PROSCAR) omeprazole (PRILOSEC)   As of today, STOP taking any Aspirin (unless otherwise instructed by your surgeon) Aleve, Naproxen, Ibuprofen, Motrin, Advil, Goody's, BC's, all herbal medications, fish oil, and all vitamins.           WHAT DO I DO ABOUT MY DIABETES MEDICATION?   Take morning dose of glipiZIDE (GLUCOTROL XL) on the day before surgery (1/8)  DO NOT take glipiZIDE (GLUCOTROL XL) on the morning of surgery (1/9)     HOW TO MANAGE YOUR DIABETES BEFORE AND AFTER SURGERY  Why is it important to control my blood sugar before and after surgery? Improving blood sugar levels before and after surgery helps healing and can limit problems. A way of improving blood sugar control is eating a healthy diet by:  Eating less sugar and carbohydrates  Increasing activity/exercise  Talking with your doctor about reaching your blood sugar goals High blood sugars (greater than 180 mg/dL) can raise your risk of infections and slow your recovery, so you will need to focus on controlling your diabetes during the weeks before surgery. Make sure that the doctor who takes care of your diabetes knows about your planned surgery including the date and location.  How do  I manage my blood sugar before surgery? Check your blood sugar at least 4 times a day, starting 2 days before surgery, to make sure that the level is not too high or low.  Check your blood sugar the morning of your surgery when you wake up and every 2 hours until you get to the Short Stay unit.  If your blood sugar is less than 70 mg/dL, you will need to treat for low blood sugar: Do not take insulin. Treat a low blood sugar (less than 70 mg/dL) with  cup of clear juice (cranberry or apple), 4 glucose tablets, OR glucose gel. Recheck blood sugar in 15 minutes after treatment (to make sure it is greater than 70 mg/dL). If your blood sugar is not greater than 70 mg/dL on recheck, call [phone] for further instructions. Report your blood sugar to the short stay nurse when you get to Short Stay.  If you are admitted to the hospital after surgery: Your blood sugar will be checked by the staff and you will probably be given insulin after surgery (instead of oral diabetes medicines) to make sure you have good blood sugar levels. The goal for blood sugar control after surgery is 80-180 mg/dL.              Do NOT Smoke (Tobacco/Vaping) or drink Alcohol 24 hours prior to your procedure.  If you use a CPAP at night, you may bring all equipment for your overnight stay.   Contacts, glasses, piercing's, hearing aid's, dentures  or partials may not be worn into surgery, please bring cases for these belongings.    For patients admitted to the hospital, discharge time will be determined by your treatment team.   Patients discharged the day of surgery will not be allowed to drive home, and someone needs to stay with them for 24 hours.  NO VISITORS WILL BE ALLOWED IN PRE-OP WHERE PATIENTS GET READY FOR SURGERY.  ONLY 1 SUPPORT PERSON MAY BE PRESENT IN THE WAITING ROOM WHILE YOU ARE IN SURGERY.  IF YOU ARE TO BE ADMITTED, ONCE YOU ARE IN YOUR ROOM YOU WILL BE ALLOWED TWO (2) VISITORS.  Minor children may  have two parents present. Special consideration for safety and communication needs will be reviewed on a case by case basis.   Special instructions:   [place]- Preparing For Surgery  Before surgery, you can play an important role. Because skin is not sterile, your skin needs to be as free of germs as possible. You can reduce the number of germs on your skin by washing with CHG (chlorahexidine gluconate) Soap before surgery.  CHG is an antiseptic cleaner which kills germs and bonds with the skin to continue killing germs even after washing.    Oral Hygiene is also important to reduce your risk of infection.  Remember - BRUSH YOUR TEETH THE MORNING OF SURGERY WITH YOUR REGULAR TOOTHPASTE  Please do not use if you have an allergy to CHG or antibacterial soaps. If your skin becomes reddened/irritated stop using the CHG.  Do not shave (including legs and underarms) for at least 48 hours prior to first CHG shower. It is OK to shave your face.  Please follow these instructions carefully.   Shower the NIGHT BEFORE SURGERY and the MORNING OF SURGERY  If you chose to wash your hair, wash your hair first as usual with your normal shampoo.  After you shampoo, rinse your hair and body thoroughly to remove the shampoo.  Use CHG Soap as you would any other liquid soap. You can apply CHG directly to the skin and wash gently with a scrungie or a clean washcloth.   Apply the CHG Soap to your body ONLY FROM THE NECK DOWN.  Do not use on open wounds or open sores. Avoid contact with your eyes, ears, mouth and genitals (private parts). Wash Face and genitals (private parts)  with your normal soap.   Wash thoroughly, paying special attention to the area where your surgery will be performed.  Thoroughly rinse your body with warm water from the neck down.  DO NOT shower/wash with your normal soap after using and rinsing off the CHG Soap.  Pat yourself dry with a CLEAN TOWEL.  Wear CLEAN PAJAMAS to bed  the night before surgery  Place CLEAN SHEETS on your bed the night before your surgery  DO NOT SLEEP WITH PETS.   Day of Surgery: Shower with CHG soap. Do not wear jewelry Do not wear lotions, powders, colognes, or deodorant. Men may shave face and neck. Do not bring valuables to the hospital. [facility] is not responsible for any belongings or valuables. Wear Clean/Comfortable clothing the morning of surgery Remember to brush your teeth WITH YOUR REGULAR TOOTHPASTE.   Please read over the following fact sheets that you were given.   3 days prior to your procedure or After your COVID test   You are not required to quarantine however you are required to wear a well-fitting mask when you are out and  around people not in your household. If your mask becomes wet or soiled, replace with a new one.   Wash your hands often with soap and water for 20 seconds or clean your hands with an alcohol-based hand sanitizer that contains at least 60% alcohol.   Do not share personal items.   Notify your provider:  o if you are in close contact with someone who has COVID  o or if you develop a fever of 100.4 or greater, sneezing, cough, sore throat, shortness of breath or body aches.

## 2022-01-03 ENCOUNTER — Other Ambulatory Visit: Payer: Self-pay

## 2022-01-03 ENCOUNTER — Encounter (HOSPITAL_COMMUNITY)
Admission: RE | Admit: 2022-01-03 | Discharge: 2022-01-03 | Disposition: A | Discharge status: Home / Self Care | Payer: Medicare Other | Source: Ambulatory Visit | Attending: Neurosurgery | Admitting: Neurosurgery

## 2022-01-03 ENCOUNTER — Other Ambulatory Visit: Payer: Self-pay | Admitting: Neurosurgery

## 2022-01-03 ENCOUNTER — Encounter (HOSPITAL_COMMUNITY): Payer: Self-pay

## 2022-01-03 VITALS — BP 167/100 | HR 92 | Temp 98.5°F | Resp 18 | Ht 73.0 in | Wt 235.6 lb

## 2022-01-03 DIAGNOSIS — K219 Gastro-esophageal reflux disease without esophagitis: Secondary | ICD-10-CM | POA: Diagnosis not present

## 2022-01-03 DIAGNOSIS — I2510 Atherosclerotic heart disease of native coronary artery without angina pectoris: Secondary | ICD-10-CM | POA: Diagnosis not present

## 2022-01-03 DIAGNOSIS — I129 Hypertensive chronic kidney disease with stage 1 through stage 4 chronic kidney disease, or unspecified chronic kidney disease: Secondary | ICD-10-CM | POA: Diagnosis not present

## 2022-01-03 DIAGNOSIS — Z8546 Personal history of malignant neoplasm of prostate: Secondary | ICD-10-CM | POA: Insufficient documentation

## 2022-01-03 DIAGNOSIS — N182 Chronic kidney disease, stage 2 (mild): Secondary | ICD-10-CM | POA: Insufficient documentation

## 2022-01-03 DIAGNOSIS — Z01818 Encounter for other preprocedural examination: Secondary | ICD-10-CM | POA: Insufficient documentation

## 2022-01-03 DIAGNOSIS — E1122 Type 2 diabetes mellitus with diabetic chronic kidney disease: Secondary | ICD-10-CM | POA: Diagnosis not present

## 2022-01-03 DIAGNOSIS — Z20822 Contact with and (suspected) exposure to covid-19: Secondary | ICD-10-CM | POA: Insufficient documentation

## 2022-01-03 DIAGNOSIS — M4712 Other spondylosis with myelopathy, cervical region: Secondary | ICD-10-CM | POA: Diagnosis not present

## 2022-01-03 DIAGNOSIS — Z87891 Personal history of nicotine dependence: Secondary | ICD-10-CM | POA: Diagnosis not present

## 2022-01-03 DIAGNOSIS — E119 Type 2 diabetes mellitus without complications: Secondary | ICD-10-CM

## 2022-01-03 HISTORY — DX: Personal history of urinary calculi: Z87.442

## 2022-01-03 HISTORY — DX: Malignant (primary) neoplasm, unspecified: C80.1

## 2022-01-03 LAB — COMPREHENSIVE METABOLIC PANEL
ALT: 39 U/L (ref 0–44)
AST: 32 U/L (ref 15–41)
Albumin: 3.5 g/dL (ref 3.5–5.0)
Alkaline Phosphatase: 61 U/L (ref 38–126)
Anion gap: 8 (ref 5–15)
BUN: 11 mg/dL (ref 8–23)
CO2: 27 mmol/L (ref 22–32)
Calcium: 9.4 mg/dL (ref 8.9–10.3)
Chloride: 104 mmol/L (ref 98–111)
Creatinine, Ser: 1.02 mg/dL (ref 0.61–1.24)
GFR, Estimated: >60 mL/min (ref >60)
Glucose, Bld: 168 mg/dL — ABNORMAL HIGH (ref 70–99)
Potassium: 4.5 mmol/L (ref 3.5–5.1)
Sodium: 139 mmol/L (ref 135–145)
Total Bilirubin: 0.6 mg/dL (ref 0.3–1.2)
Total Protein: 6.7 g/dL (ref 6.5–8.1)

## 2022-01-03 LAB — CBC
HCT: 45.3 % (ref 39.0–52.0)
Hemoglobin: 15.1 g/dL (ref 13.0–17.0)
MCH: 31.7 pg (ref 26.0–34.0)
MCHC: 33.3 g/dL (ref 30.0–36.0)
MCV: 95.2 fL (ref 80.0–100.0)
Platelets: 180 10*3/uL (ref 150–400)
RBC: 4.76 MIL/uL (ref 4.22–5.81)
RDW: 13.6 % (ref 11.5–15.5)
WBC: 5.1 10*3/uL (ref 4.0–10.5)
nRBC: 0 % (ref 0.0–0.2)

## 2022-01-03 LAB — HEMOGLOBIN A1C
Hgb A1c MFr Bld: 7.1 % — ABNORMAL HIGH (ref 4.8–5.6)
Mean Plasma Glucose: 157.07 mg/dL

## 2022-01-03 LAB — TYPE AND SCREEN
ABO/RH(D): B POS
Antibody Screen: NEGATIVE

## 2022-01-03 LAB — GLUCOSE, CAPILLARY: Glucose-Capillary: 182 mg/dL — ABNORMAL HIGH (ref 70–99)

## 2022-01-03 LAB — SARS CORONAVIRUS 2 (TAT 6-24 HRS): SARS Coronavirus 2: NEGATIVE

## 2022-01-03 LAB — SURGICAL PCR SCREEN
MRSA, PCR: NEGATIVE
Staphylococcus aureus: NEGATIVE

## 2022-01-03 NOTE — Progress Notes (Signed)
PCP: Dr. Gar Ponto Cardiologist: Shelva Majestic  EKG: 01/03/22 CXR:na ECHO: 05/26/17 Stress Test: denies Cardiac Cath: 08/27/18  Fasting Blood Sugar- 130's in the morning Checks Blood Sugar__1_ times every other day  ASA/Blood Thinner: No  OSA/CPAP: No  Covid test 01/03/22 at PAT  Anesthesia Review: Yes, cardiac history  Patient denies shortness of breath, fever, cough, and chest pain at PAT appointment.  Patient verbalized understanding of instructions provided today at the PAT appointment.  Patient asked to review instructions at home and day of surgery.

## 2022-01-03 NOTE — Progress Notes (Signed)
Anesthesia Chart Review:  Case: [id number] Date/Time: 01/06/22 0945   Procedure: ACDF C34, C45 - 3C   Anesthesia type: General   Pre-op diagnosis: MYELOPATHY   Location: [facility] OR ROOM 14 / [facility]   Surgeons: Kary Kos, MD       DISCUSSION: Patient is a 66 year old male scheduled for the above procedure.  PAT visit with Friday, 01/03/2022.  He was recently referred to neurosurgery by neurology following cervical MRI showing spinal stenosis/cervical myelopathy.   History includes former smoker (quit 10/12/06), HTN, GERD, DM2, CAD (mild non-obstructive 07/2018), dilated aortic root (4 cm 08/19/18), fatty liver, prostate cancer, CKD (stage II).   Patient had only mild CAD in 2019 and was asymptomatic at his last 04/12/20 cardiology. Apparently, work-up had been initiated due to dyspnea found that his dyspnea improved after he was diagnosed with Alpha-gal syndrome and gave up red meat. Continued ASA and statin recommended, although he is not currently on (has statin intolerance due to joint pain). He was intolerant to previous trial of b-blocker. Patient preferred PCP management, so as needed cardiology follow-up planned.  He denied shortness of breath, cough, fever, chest pain at PAT RN visit.  Preoperative EKG showed NSR.  01/03/2022 presurgical COVID-19 test negative.  Anesthesia team to evaluate on the day of surgery.  A1c is still in process, but he reported fasting CBGs around 130's.   VS: BP (!) 167/100    Pulse 92    Temp 36.9 C (Oral)    Resp 18    Ht 6\' 1"  (1.854 m)    Wt 106.9 kg    SpO2 100%    BMI 31.08 kg/m    PROVIDERS: Caryl Bis, MD Metta Clines, DO is neurologist Franchot Gallo, MD is urologist Kate Sable, MD was his cardiologist with [facility], but he is no longer with that practice. Last visit was on 04/12/20 by Fulton Reek, PA-C for preoperative evaluation prior to [place].  He was able to perform 4 METS at that time.  Aspirin and statin therapy recommended for  known mild nonobstructive CAD.  He was intolerant to beta-blocker in the past.  Patient preferred ongoing follow-up/management by primary care so as needed cardiology follow-up recommended.   LABS: Labs reviewed: Acceptable for surgery. (all labs ordered are listed, but only abnormal results are displayed)  Labs Reviewed  GLUCOSE, CAPILLARY - Abnormal; Notable for the following components:      Result Value   Glucose-Capillary 182 (*)    All other components within normal limits  COMPREHENSIVE METABOLIC PANEL - Abnormal; Notable for the following components:   Glucose, Bld 168 (*)    All other components within normal limits  SURGICAL PCR SCREEN  SARS CORONAVIRUS 2 (TAT 6-24 HRS)  CBC  HEMOGLOBIN A1C  TYPE AND SCREEN   PFTs > 3 year ago.   IMAGES: MRI C-spine 12/17/21: IMPRESSION: 1. Multilevel degenerative changes of the cervical spine as described above, worst at C3-C4 where there is moderate spinal canal stenosis and severe bilateral neuroforaminal stenosis. 2. Mild spinal canal and moderate bilateral neuroforaminal stenosis at C4-C5.  EKG: 01/03/22: NSR. Q wave in III (old).   CV: Cardiac cath 08/27/18: Ost LAD to Prox LAD lesion is 35% stenosed. Prox LAD to Mid LAD lesion is 20% stenosed. Mid LAD lesion is 5% stenosed. Ost 1st Mrg lesion is 10% stenosed.   Mild nonobstructive CAD with evidence for 30%-40% plaque in the proximal LAD, 20% mild calcification of the mid LAD followed by a more  calcified segment with narrowing of less than 5%.  There was normal flow distally in the LAD wrapped around the apex.  The circumflex vessel was a codominant vessel.  There was minimal less than 10% plaque in the OM1 vessel.  The remainder of the vessel was free of significant disease.  The RCA was a codominant vessel and appeared normal.   Attempt at crossing into the left ventricle was unsuccessful due to radial artery spasm.   RECOMMENDATION: High potency statin therapy and  attempt to induce plaque regression.  Medical therapy for CAD.   Echo 05/26/17: Conclusions: 1.  The estimated ejection fraction is 55 to 60%. 2.  The left atrium is mildly dilated. 3.  The right ventricular systolic function is normal.   Past Medical History:  Diagnosis Date   CAD (coronary artery disease)    a. nonobstructive by cath 2019.   Cancer [facility])    prostate   CKD (chronic kidney disease), stage II    Diabetes ([place])    Dilated aortic root ([place])    a. 4cm by CT 2019.   ED (erectile dysfunction)    Fatty liver    by imaging   GERD (gastroesophageal reflux disease)    History of kidney stones    Hypertension     Past Surgical History:  Procedure Laterality Date   APPENDECTOMY  1961   CATARACT EXTRACTION, BILATERAL  2016   hammertoe repair Right 2016   LEFT HEART CATH AND CORONARY ANGIOGRAPHY N/A 08/27/2018   Procedure: LEFT HEART CATH AND CORONARY ANGIOGRAPHY;  Surgeon: Troy Sine, MD;  Location: [facility];  Service: Cardiovascular;  Laterality: N/A;   NASAL SINUS SURGERY  2000   deviated septum, polyp removal   SHOULDER SURGERY Left 2007    MEDICATIONS:  Ascorbic Acid (VITAMIN C) 1000 MG tablet   cholecalciferol (VITAMIN D3) 25 MCG (1000 UNIT) tablet   fexofenadine (ALLEGRA) 180 MG tablet   finasteride (PROSCAR) 5 MG tablet   fluticasone (FLONASE) 50 MCG/ACT nasal spray   glipiZIDE (GLUCOTROL XL) 2.5 MG 24 hr tablet   L-Arginine 500 MG CAPS   lisinopril (ZESTRIL) 40 MG tablet   Magnesium Cl-Calcium Carbonate (SLOW-MAG PO)   omeprazole (PRILOSEC) 20 MG capsule   vitamin B-12 (CYANOCOBALAMIN) 1000 MCG tablet   No current facility-administered medications for this encounter.    Myra Gianotti, PA-C Surgical Short Stay/Anesthesiology [facility] Phone [phone] [facility] Phone [phone] 01/03/2022 4:54 PM

## 2022-01-03 NOTE — Anesthesia Preprocedure Evaluation (Addendum)
Anesthesia Evaluation  Patient identified by MRN, date of birth, ID band Patient awake    Reviewed: Allergy & Precautions, NPO status , Patient's Chart, lab work & pertinent test results  Airway Mallampati: I  TM Distance: >3 FB Neck ROM: Limited    Dental no notable dental hx. (+) Teeth Intact, Dental Advisory Given   Pulmonary neg pulmonary ROS, former smoker,    Pulmonary exam normal breath sounds clear to auscultation       Cardiovascular hypertension, + CAD  Normal cardiovascular exam Rhythm:Regular Rate:Normal  EKG: 01/03/22: NSR. Q wave in III (old).  CV: Cardiac cath 08/27/18: Ost LAD to Prox LAD lesion is 35% stenosed. Prox LAD to Mid LAD lesion is 20% stenosed. Mid LAD lesion is 5% stenosed. Ost 1st Mrg lesion is 10% stenosed.  Mild nonobstructive CAD with evidence for 30%-40% plaque in the proximal LAD, 20% mild calcification of the mid LAD followed by a more calcified segment with narrowing of less than 5%. There was normal flowdistally in the LAD wrapped around the apex. The circumflex vessel was a codominant vessel. There was minimal less than 10% plaque in the OM1 vessel. The remainder of the vessel was free of significant disease. The RCA was a codominant vessel and appeared normal.  Attempt at crossing into the left ventricle was unsuccessful due to radial artery spasm.  RECOMMENDATION: High potency statin therapy and attempt to induce plaque regression. Medical therapy for CAD.   Echo 05/26/17: Conclusions: 1.  The estimated ejection fraction is 55 to 60%. 2.  The left atrium is mildly dilated. 3.  The right ventricular systolic function is normal.   Neuro/Psych negative neurological ROS  negative psych ROS   GI/Hepatic Neg liver ROS, GERD  ,  Endo/Other  diabetes, Type 2, Oral Hypoglycemic Agents  Renal/GU Renal InsufficiencyRenal disease  negative genitourinary    Musculoskeletal negative musculoskeletal ROS (+)   Abdominal   Peds  Hematology negative hematology ROS (+)   Anesthesia Other Findings History includes former smoker (quit 10/12/06), HTN, GERD, DM2, CAD (mild non-obstructive 07/2018), dilated aortic root (4 cm 08/19/18), fatty liver, prostate cancer, CKD (stage II).   Reproductive/Obstetrics                          Anesthesia Physical Anesthesia Plan  ASA: 3  Anesthesia Plan: General   Post-op Pain Management: Tylenol PO (pre-op)   Induction: Intravenous  PONV Risk Score and Plan: 2 and Midazolam, Dexamethasone and Ondansetron  Airway Management Planned: Oral ETT  Additional Equipment:   Intra-op Plan:   Post-operative Plan: Extubation in OR  Informed Consent: I have reviewed the patients History and Physical, chart, labs and discussed the procedure including the risks, benefits and alternatives for the proposed anesthesia with the patient or authorized representative who has indicated his/her understanding and acceptance.     Dental advisory given  Plan Discussed with: CRNA  Anesthesia Plan Comments:        Anesthesia Quick Evaluation

## 2022-01-06 ENCOUNTER — Ambulatory Visit (HOSPITAL_COMMUNITY)
Admission: RE | Admit: 2022-01-06 | Discharge: 2022-01-07 | Disposition: A | Discharge status: Home / Self Care | Payer: Medicare Other | Attending: Neurosurgery | Admitting: Neurosurgery

## 2022-01-06 ENCOUNTER — Ambulatory Visit (HOSPITAL_COMMUNITY): Payer: Medicare Other

## 2022-01-06 ENCOUNTER — Ambulatory Visit (HOSPITAL_COMMUNITY): Payer: Medicare Other | Admitting: Vascular Surgery

## 2022-01-06 ENCOUNTER — Encounter (HOSPITAL_COMMUNITY): Payer: Self-pay | Admitting: Neurosurgery

## 2022-01-06 ENCOUNTER — Ambulatory Visit (HOSPITAL_COMMUNITY): Payer: Medicare Other | Admitting: Anesthesiology

## 2022-01-06 ENCOUNTER — Encounter (HOSPITAL_COMMUNITY)
Admission: RE | Disposition: A | Discharge status: Home / Self Care | Payer: Self-pay | Source: Home / Self Care | Attending: Neurosurgery

## 2022-01-06 DIAGNOSIS — K219 Gastro-esophageal reflux disease without esophagitis: Secondary | ICD-10-CM | POA: Insufficient documentation

## 2022-01-06 DIAGNOSIS — M4802 Spinal stenosis, cervical region: Secondary | ICD-10-CM | POA: Diagnosis not present

## 2022-01-06 DIAGNOSIS — E1122 Type 2 diabetes mellitus with diabetic chronic kidney disease: Secondary | ICD-10-CM | POA: Insufficient documentation

## 2022-01-06 DIAGNOSIS — Z7984 Long term (current) use of oral hypoglycemic drugs: Secondary | ICD-10-CM | POA: Diagnosis not present

## 2022-01-06 DIAGNOSIS — M4722 Other spondylosis with radiculopathy, cervical region: Secondary | ICD-10-CM | POA: Insufficient documentation

## 2022-01-06 DIAGNOSIS — I129 Hypertensive chronic kidney disease with stage 1 through stage 4 chronic kidney disease, or unspecified chronic kidney disease: Secondary | ICD-10-CM | POA: Diagnosis not present

## 2022-01-06 DIAGNOSIS — M2578 Osteophyte, vertebrae: Secondary | ICD-10-CM | POA: Diagnosis not present

## 2022-01-06 DIAGNOSIS — I2510 Atherosclerotic heart disease of native coronary artery without angina pectoris: Secondary | ICD-10-CM | POA: Insufficient documentation

## 2022-01-06 DIAGNOSIS — N182 Chronic kidney disease, stage 2 (mild): Secondary | ICD-10-CM | POA: Diagnosis not present

## 2022-01-06 DIAGNOSIS — Z87891 Personal history of nicotine dependence: Secondary | ICD-10-CM | POA: Insufficient documentation

## 2022-01-06 DIAGNOSIS — Z419 Encounter for procedure for purposes other than remedying health state, unspecified: Secondary | ICD-10-CM

## 2022-01-06 HISTORY — PX: ANTERIOR CERVICAL DECOMP/DISCECTOMY FUSION: SHX1161

## 2022-01-06 LAB — GLUCOSE, CAPILLARY
Glucose-Capillary: 150 mg/dL — ABNORMAL HIGH (ref 70–99)
Glucose-Capillary: 116 mg/dL — ABNORMAL HIGH (ref 70–99)
Glucose-Capillary: 150 mg/dL — ABNORMAL HIGH (ref 70–99)
Glucose-Capillary: 242 mg/dL — ABNORMAL HIGH (ref 70–99)
Glucose-Capillary: 259 mg/dL — ABNORMAL HIGH (ref 70–99)

## 2022-01-06 LAB — ABO/RH: ABO/RH(D): B POS

## 2022-01-06 SURGERY — ANTERIOR CERVICAL DECOMPRESSION/DISCECTOMY FUSION 2 LEVELS
Anesthesia: General | Site: Neck

## 2022-01-06 MED ORDER — THROMBIN 5000 UNITS EX SOLR
CUTANEOUS | Status: DC | PRN
  Administered 2022-01-06 (×2): 5000 [IU] via TOPICAL

## 2022-01-06 MED ORDER — SUGAMMADEX SODIUM 200 MG/2ML IV SOLN
INTRAVENOUS | Status: DC | PRN
Start: 2022-01-06 — End: 2022-01-06
  Administered 2022-01-06: 200 mg via INTRAVENOUS

## 2022-01-06 MED ORDER — MAGNESIUM CHLORIDE 64 MG PO TBEC
2.0000 | DELAYED_RELEASE_TABLET | Freq: Two times a day (BID) | ORAL | Status: DC
  Administered 2022-01-06: 128 mg via ORAL
  Filled 2022-01-06 (×3): qty 2

## 2022-01-06 MED ORDER — PHENYLEPHRINE 40 MCG/ML (10ML) SYRINGE FOR IV PUSH (FOR BLOOD PRESSURE SUPPORT)
PREFILLED_SYRINGE | INTRAVENOUS | Status: DC | PRN
  Administered 2022-01-06: 80 ug via INTRAVENOUS
  Administered 2022-01-06: 120 ug via INTRAVENOUS
  Administered 2022-01-06 (×2): 80 ug via INTRAVENOUS
  Administered 2022-01-06: 40 ug via INTRAVENOUS

## 2022-01-06 MED ORDER — PANTOPRAZOLE SODIUM 40 MG PO TBEC
40.0000 mg | DELAYED_RELEASE_TABLET | Freq: Every day | ORAL | Status: DC
  Administered 2022-01-07: 40 mg via ORAL
  Filled 2022-01-06: qty 1

## 2022-01-06 MED ORDER — ALUM & MAG HYDROXIDE-SIMETH 200-200-20 MG/5ML PO SUSP: 30.0000 mL | Freq: Four times a day (QID) | ORAL | Status: DC | PRN

## 2022-01-06 MED ORDER — ASCORBIC ACID 500 MG PO TABS: 1000.0000 mg | ORAL_TABLET | Freq: Every day | ORAL | Status: DC

## 2022-01-06 MED ORDER — FENTANYL CITRATE (PF) 250 MCG/5ML IJ SOLN
INTRAMUSCULAR | Status: DC | PRN
Start: 2022-01-06 — End: 2022-01-06
  Administered 2022-01-06: 100 ug via INTRAVENOUS
  Administered 2022-01-06 (×2): 50 ug via INTRAVENOUS

## 2022-01-06 MED ORDER — ONDANSETRON HCL 4 MG/2ML IJ SOLN
INTRAMUSCULAR | Status: AC
  Filled 2022-01-06: qty 2

## 2022-01-06 MED ORDER — HYDROMORPHONE HCL 1 MG/ML IJ SOLN
0.5000 mg | INTRAMUSCULAR | Status: DC | PRN
  Administered 2022-01-06: 0.5 mg via INTRAVENOUS
  Filled 2022-01-06: qty 0.5

## 2022-01-06 MED ORDER — CHLORHEXIDINE GLUCONATE 0.12 % MT SOLN
15.0000 mL | Freq: Once | OROMUCOSAL | Status: AC
  Administered 2022-01-06: 15 mL via OROMUCOSAL
  Filled 2022-01-06: qty 15

## 2022-01-06 MED ORDER — ONDANSETRON HCL 4 MG/2ML IJ SOLN: 4.0000 mg | Freq: Four times a day (QID) | INTRAMUSCULAR | Status: DC | PRN

## 2022-01-06 MED ORDER — PHENOL 1.4 % MT LIQD: 1.0000 | OROMUCOSAL | Status: DC | PRN

## 2022-01-06 MED ORDER — ACETAMINOPHEN 500 MG PO TABS
1000.0000 mg | ORAL_TABLET | Freq: Once | ORAL | Status: AC
  Administered 2022-01-06: 1000 mg via ORAL
  Filled 2022-01-06: qty 2

## 2022-01-06 MED ORDER — CEFAZOLIN SODIUM-DEXTROSE 2-4 GM/100ML-% IV SOLN
INTRAVENOUS | Status: AC
  Filled 2022-01-06: qty 100

## 2022-01-06 MED ORDER — LISINOPRIL 20 MG PO TABS
40.0000 mg | ORAL_TABLET | Freq: Two times a day (BID) | ORAL | Status: DC
  Administered 2022-01-06 – 2022-01-07 (×2): 40 mg via ORAL
  Filled 2022-01-06 (×2): qty 2

## 2022-01-06 MED ORDER — INSULIN ASPART 100 UNIT/ML IJ SOLN
0.0000 [IU] | Freq: Three times a day (TID) | INTRAMUSCULAR | Status: DC
  Administered 2022-01-06: 5 [IU] via SUBCUTANEOUS

## 2022-01-06 MED ORDER — LORATADINE 10 MG PO TABS: 10.0000 mg | ORAL_TABLET | Freq: Every day | ORAL | Status: DC

## 2022-01-06 MED ORDER — VITAMIN B-12 1000 MCG PO TABS: 1000.0000 ug | ORAL_TABLET | Freq: Every day | ORAL | Status: DC

## 2022-01-06 MED ORDER — THROMBIN 5000 UNITS EX SOLR
CUTANEOUS | Status: AC
  Filled 2022-01-06: qty 15000

## 2022-01-06 MED ORDER — DEXAMETHASONE SODIUM PHOSPHATE 10 MG/ML IJ SOLN
INTRAMUSCULAR | Status: AC
  Filled 2022-01-06: qty 1

## 2022-01-06 MED ORDER — INSULIN ASPART 100 UNIT/ML IJ SOLN
0.0000 [IU] | Freq: Every day | INTRAMUSCULAR | Status: DC
  Administered 2022-01-06: 3 [IU] via SUBCUTANEOUS

## 2022-01-06 MED ORDER — CEFAZOLIN SODIUM-DEXTROSE 2-4 GM/100ML-% IV SOLN
2.0000 g | Freq: Three times a day (TID) | INTRAVENOUS | Status: AC
  Administered 2022-01-06 – 2022-01-07 (×2): 2 g via INTRAVENOUS
  Filled 2022-01-06 (×2): qty 100

## 2022-01-06 MED ORDER — ONDANSETRON HCL 4 MG/2ML IJ SOLN
INTRAMUSCULAR | Status: DC | PRN
Start: 2022-01-06 — End: 2022-01-06
  Administered 2022-01-06: 4 mg via INTRAVENOUS

## 2022-01-06 MED ORDER — PROPOFOL 10 MG/ML IV BOLUS
INTRAVENOUS | Status: DC | PRN
Start: 2022-01-06 — End: 2022-01-06
  Administered 2022-01-06: 150 mg via INTRAVENOUS
  Administered 2022-01-06: 20 mg via INTRAVENOUS

## 2022-01-06 MED ORDER — GLIPIZIDE ER 2.5 MG PO TB24
2.5000 mg | ORAL_TABLET | Freq: Every day | ORAL | Status: DC
  Administered 2022-01-07: 2.5 mg via ORAL
  Filled 2022-01-06: qty 1

## 2022-01-06 MED ORDER — CEFAZOLIN SODIUM-DEXTROSE 2-3 GM-%(50ML) IV SOLR
INTRAVENOUS | Status: DC | PRN
  Administered 2022-01-06: 2 g via INTRAVENOUS

## 2022-01-06 MED ORDER — DEXAMETHASONE SODIUM PHOSPHATE 10 MG/ML IJ SOLN
INTRAMUSCULAR | Status: DC | PRN
Start: 2022-01-06 — End: 2022-01-06
  Administered 2022-01-06: 10 mg via INTRAVENOUS

## 2022-01-06 MED ORDER — OXYCODONE HCL 5 MG PO TABS
10.0000 mg | ORAL_TABLET | ORAL | Status: DC | PRN
  Administered 2022-01-06 – 2022-01-07 (×2): 10 mg via ORAL
  Filled 2022-01-06 (×2): qty 2

## 2022-01-06 MED ORDER — LACTATED RINGERS IV SOLN: INTRAVENOUS | Status: DC

## 2022-01-06 MED ORDER — CYCLOBENZAPRINE HCL 10 MG PO TABS
10.0000 mg | ORAL_TABLET | Freq: Three times a day (TID) | ORAL | Status: DC | PRN
  Administered 2022-01-06 – 2022-01-07 (×2): 10 mg via ORAL
  Filled 2022-01-06 (×2): qty 1

## 2022-01-06 MED ORDER — PHENYLEPHRINE 40 MCG/ML (10ML) SYRINGE FOR IV PUSH (FOR BLOOD PRESSURE SUPPORT)
PREFILLED_SYRINGE | INTRAVENOUS | Status: AC
  Filled 2022-01-06: qty 10

## 2022-01-06 MED ORDER — ORAL CARE MOUTH RINSE: 15.0000 mL | Freq: Once | OROMUCOSAL | Status: AC

## 2022-01-06 MED ORDER — FINASTERIDE 5 MG PO TABS: 5.0000 mg | ORAL_TABLET | Freq: Every day | ORAL | Status: DC

## 2022-01-06 MED ORDER — FENTANYL CITRATE (PF) 100 MCG/2ML IJ SOLN
INTRAMUSCULAR | Status: AC
  Filled 2022-01-06: qty 2

## 2022-01-06 MED ORDER — ONDANSETRON HCL 4 MG PO TABS: 4.0000 mg | ORAL_TABLET | Freq: Four times a day (QID) | ORAL | Status: DC | PRN

## 2022-01-06 MED ORDER — MENTHOL 3 MG MT LOZG: 1.0000 | LOZENGE | OROMUCOSAL | Status: DC | PRN

## 2022-01-06 MED ORDER — FENTANYL CITRATE (PF) 250 MCG/5ML IJ SOLN
INTRAMUSCULAR | Status: AC
  Filled 2022-01-06: qty 5

## 2022-01-06 MED ORDER — PHENYLEPHRINE HCL-NACL 20-0.9 MG/250ML-% IV SOLN
INTRAVENOUS | Status: DC | PRN
  Administered 2022-01-06: 25 ug/min via INTRAVENOUS

## 2022-01-06 MED ORDER — L-ARGININE 500 MG PO CAPS: 500.0000 mg | ORAL_CAPSULE | Freq: Two times a day (BID) | ORAL | Status: DC

## 2022-01-06 MED ORDER — PROPOFOL 10 MG/ML IV BOLUS
INTRAVENOUS | Status: AC
  Filled 2022-01-06: qty 20

## 2022-01-06 MED ORDER — ROCURONIUM BROMIDE 10 MG/ML (PF) SYRINGE
PREFILLED_SYRINGE | INTRAVENOUS | Status: DC | PRN
Start: 2022-01-06 — End: 2022-01-06
  Administered 2022-01-06: 100 mg via INTRAVENOUS

## 2022-01-06 MED ORDER — VITAMIN D 25 MCG (1000 UNIT) PO TABS: 1000.0000 [IU] | ORAL_TABLET | Freq: Every day | ORAL | Status: DC

## 2022-01-06 MED ORDER — ACETAMINOPHEN 650 MG RE SUPP: 650.0000 mg | RECTAL | Status: DC | PRN

## 2022-01-06 MED ORDER — LIDOCAINE 2% (20 MG/ML) 5 ML SYRINGE
INTRAMUSCULAR | Status: DC | PRN
Start: 2022-01-06 — End: 2022-01-06
  Administered 2022-01-06: 100 mg via INTRAVENOUS

## 2022-01-06 MED ORDER — MIDAZOLAM HCL 5 MG/5ML IJ SOLN
INTRAMUSCULAR | Status: DC | PRN
  Administered 2022-01-06: 2 mg via INTRAVENOUS

## 2022-01-06 MED ORDER — SODIUM CHLORIDE 0.9% FLUSH: 3.0000 mL | INTRAVENOUS | Status: DC | PRN

## 2022-01-06 MED ORDER — SODIUM CHLORIDE 0.9 % IV SOLN: 250.0000 mL | INTRAVENOUS | Status: DC

## 2022-01-06 MED ORDER — ROCURONIUM BROMIDE 10 MG/ML (PF) SYRINGE
PREFILLED_SYRINGE | INTRAVENOUS | Status: AC
  Filled 2022-01-06: qty 10

## 2022-01-06 MED ORDER — FLUTICASONE PROPIONATE 50 MCG/ACT NA SUSP
2.0000 | Freq: Every day | NASAL | Status: DC
  Filled 2022-01-06: qty 16

## 2022-01-06 MED ORDER — THROMBIN (RECOMBINANT) 5000 UNITS EX SOLR
CUTANEOUS | Status: AC
  Filled 2022-01-06: qty 15000

## 2022-01-06 MED ORDER — MIDAZOLAM HCL 2 MG/2ML IJ SOLN
INTRAMUSCULAR | Status: AC
  Filled 2022-01-06: qty 2

## 2022-01-06 MED ORDER — 0.9 % SODIUM CHLORIDE (POUR BTL) OPTIME
TOPICAL | Status: DC | PRN
  Administered 2022-01-06: 1000 mL

## 2022-01-06 MED ORDER — ACETAMINOPHEN 325 MG PO TABS: 650.0000 mg | ORAL_TABLET | ORAL | Status: DC | PRN

## 2022-01-06 MED ORDER — FENTANYL CITRATE (PF) 100 MCG/2ML IJ SOLN
25.0000 ug | INTRAMUSCULAR | Status: DC | PRN
  Administered 2022-01-06: 50 ug via INTRAVENOUS

## 2022-01-06 MED ORDER — LIDOCAINE 2% (20 MG/ML) 5 ML SYRINGE
INTRAMUSCULAR | Status: AC
  Filled 2022-01-06: qty 5

## 2022-01-06 MED ORDER — SODIUM CHLORIDE 0.9% FLUSH: 3.0000 mL | Freq: Two times a day (BID) | INTRAVENOUS | Status: DC

## 2022-01-06 MED ORDER — PANTOPRAZOLE SODIUM 40 MG IV SOLR: 40.0000 mg | Freq: Every day | INTRAVENOUS | Status: DC

## 2022-01-06 SURGICAL SUPPLY — 57 items
BAG COUNTER SPONGE SURGICOUNT (BAG) ×2 IMPLANT
BAND RUBBER #18 3X1/16 STRL (MISCELLANEOUS) ×4 IMPLANT
BASKET BONE COLLECTION (BASKET) ×2 IMPLANT
BENZOIN TINCTURE PRP APPL 2/3 (GAUZE/BANDAGES/DRESSINGS) ×2 IMPLANT
BIT DRILL NEURO 2X3.1 SFT TUCH (MISCELLANEOUS) ×1 IMPLANT
BONE VIVIGEN FORMABLE 1.3CC (Bone Implant) ×2 IMPLANT
BUR MATCHSTICK NEURO 3.0 LAGG (BURR) ×2 IMPLANT
CANISTER SUCT 3000ML PPV (MISCELLANEOUS) ×2 IMPLANT
CARTRIDGE OIL MAESTRO DRILL (MISCELLANEOUS) ×1 IMPLANT
DERMABOND ADVANCED (GAUZE/BANDAGES/DRESSINGS) ×1
DERMABOND ADVANCED .7 DNX12 (GAUZE/BANDAGES/DRESSINGS) IMPLANT
DIFFUSER DRILL AIR PNEUMATIC (MISCELLANEOUS) ×2 IMPLANT
DRAPE C-ARM 42X72 X-RAY (DRAPES) ×4 IMPLANT
DRAPE LAPAROTOMY 100X72 PEDS (DRAPES) ×2 IMPLANT
DRAPE MICROSCOPE LEICA (MISCELLANEOUS) ×2 IMPLANT
DRILL NEURO 2X3.1 SOFT TOUCH (MISCELLANEOUS) ×2
DRSG OPSITE POSTOP 4X6 (GAUZE/BANDAGES/DRESSINGS) ×1 IMPLANT
DURAPREP 6ML APPLICATOR 50/CS (WOUND CARE) ×2 IMPLANT
ELECT COATED BLADE 2.86 ST (ELECTRODE) ×2 IMPLANT
ELECT REM PT RETURN 9FT ADLT (ELECTROSURGICAL) ×2
ELECTRODE REM PT RTRN 9FT ADLT (ELECTROSURGICAL) ×1 IMPLANT
GAUZE 4X4 16PLY ~~LOC~~+RFID DBL (SPONGE) ×1 IMPLANT
GAUZE SPONGE 4X4 12PLY STRL (GAUZE/BANDAGES/DRESSINGS) ×1 IMPLANT
GLOVE EXAM NITRILE XL STR (GLOVE) IMPLANT
GLOVE SURG ENC MOIS LTX SZ7 (GLOVE) ×1 IMPLANT
GLOVE SURG ENC MOIS LTX SZ8 (GLOVE) ×2 IMPLANT
GLOVE SURG UNDER LTX SZ8.5 (GLOVE) ×2 IMPLANT
GLOVE SURG UNDER POLY LF SZ7 (GLOVE) ×1 IMPLANT
GOWN STRL REUS W/ TWL LRG LVL3 (GOWN DISPOSABLE) IMPLANT
GOWN STRL REUS W/ TWL XL LVL3 (GOWN DISPOSABLE) ×1 IMPLANT
GOWN STRL REUS W/TWL 2XL LVL3 (GOWN DISPOSABLE) IMPLANT
GOWN STRL REUS W/TWL LRG LVL3 (GOWN DISPOSABLE) ×8
GOWN STRL REUS W/TWL XL LVL3 (GOWN DISPOSABLE) ×4
GRAFT BNE MATRIX VG FRMBL SM 1 (Bone Implant) IMPLANT
HALTER HD/CHIN CERV TRACTION D (MISCELLANEOUS) ×2 IMPLANT
HEMOSTAT POWDER KIT SURGIFOAM (HEMOSTASIS) ×1 IMPLANT
KIT BASIN OR (CUSTOM PROCEDURE TRAY) ×2 IMPLANT
KIT TURNOVER KIT B (KITS) ×2 IMPLANT
NDL SPNL 20GX3.5 QUINCKE YW (NEEDLE) ×1 IMPLANT
NEEDLE SPNL 20GX3.5 QUINCKE YW (NEEDLE) ×2 IMPLANT
NS IRRIG 1000ML POUR BTL (IV SOLUTION) ×2 IMPLANT
OIL CARTRIDGE MAESTRO DRILL (MISCELLANEOUS) ×2
PACK LAMINECTOMY NEURO (CUSTOM PROCEDURE TRAY) ×2 IMPLANT
PAD ARMBOARD 7.5X6 YLW CONV (MISCELLANEOUS) ×6 IMPLANT
PIN DISTRACTION 14MM (PIN) IMPLANT
PLATE CERV RES 30 2L (Plate) ×1 IMPLANT
SCREW VA SD RESONATE 4.2X14 (Screw) ×6 IMPLANT
SPACER HEDRON C 12X14X6 0D (Spacer) ×2 IMPLANT
SPONGE INTESTINAL PEANUT (DISPOSABLE) ×2 IMPLANT
SPONGE SURGIFOAM ABS GEL SZ50 (HEMOSTASIS) ×1 IMPLANT
STRIP CLOSURE SKIN 1/2X4 (GAUZE/BANDAGES/DRESSINGS) ×2 IMPLANT
SUT VIC AB 3-0 SH 8-18 (SUTURE) ×2 IMPLANT
SUT VICRYL 4-0 PS2 18IN ABS (SUTURE) ×2 IMPLANT
TAPE CLOTH 4X10 WHT NS (GAUZE/BANDAGES/DRESSINGS) ×2 IMPLANT
TOWEL GREEN STERILE (TOWEL DISPOSABLE) ×2 IMPLANT
TOWEL GREEN STERILE FF (TOWEL DISPOSABLE) ×2 IMPLANT
WATER STERILE IRR 1000ML POUR (IV SOLUTION) ×2 IMPLANT

## 2022-01-06 NOTE — Op Note (Signed)
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  Due to patient's progression of clinical syndrome imaging findings and failed conservative treatment I recommended anterior cervical discectomy and fusion at those 2 levels.  I extensively reviewed the risks and benefits of the operation with him as well as perioperative course expectations of outcome and alternatives of surgery and he understood and agreed to proceed forward.  Operative procedure: Patient was brought into the OR was induced under general anesthesia positioned supine the neck in slight extension 5 pounds halter traction.  The right side was next prepped and draped in routine sterile fashion.  Preoperative x-ray localized the appropriate level so a curvilinear incision was made just off the midline to the anterior border of the sternocleidomastoid and the superficial abscess was dissected out divided longitudinally.  The avascular plane between the sternal cut between the sternocleidomastoid and the strap muscles was developed down to the prevertebral fascia and prevertebral fascia was dissected away with Kitners.  Intraoperative x-ray confirmed identification appropriate level.  So annulotomy was made with a 15 blade scalpel to mark the disc base longus goes reflected laterally and self-retaining retractors  were placed.  Anterior osteophytes were bitten off with a Leksell rongeur and a 2 and 3 Miller Kerrison punch.  Both disc bases were drilled down capturing the bone shavings and mucus trap.  Under microscopic lamination first working at C3-4 large posterior spurs were drilled away thinning amount and removed in piecemeal fashion with a 1 and 2 mm Kerrison punch.  Posterior annulus and posterior logical ligament was also identified and removed decompressing central canal.  Marked spondylosis and severe compression of the thecal sac centrally as well as foraminally was identified and removed decompressing both C4 nerve roots and the central canal.  This was then packed away and attention taken at C4-5 in a similar fashion C4-5 was drilled down large central spurs were removed by aggressively under biting both endplates both C5 nerve roots were identified and skeletonized flush the pedicle.  At the end of discectomy there is no further stenosis either centrally or foraminally the wound was then copiously irrigated meticulous hemostasis was maintained to 6 mm 0 degree cages were sized up and packed with the locally harvested autograft mix and inserted some additional autograft mix was packed laterally to the cages and anteriorly a 30 mm globus plate was selected and inserted all screws excellent purchase locking mechanism was engaged.  Was then copiously irrigated to Kassim states was maintained the wound was closed in layers with opted Vicryl and a running 4 subcuticular Dermabond benzoin Steri-Strips and a sterile dressing was applied patient recovery in stable condition.  At the end the case all needle count sponge counts were correct.

## 2022-01-06 NOTE — Anesthesia Procedure Notes (Signed)
Procedure Name: Intubation Date/Time: 01/06/2022 11:56 AM Performed by: Oletta Lamas, CRNA Pre-anesthesia Checklist: Patient identified, Emergency Drugs available, Suction available and Patient being monitored Patient Re-evaluated:Patient Re-evaluated prior to induction Oxygen Delivery Method: Circle System Utilized Preoxygenation: Pre-oxygenation with 100% oxygen Induction Type: IV induction Ventilation: Mask ventilation without difficulty Laryngoscope Size: Glidescope and 4 Grade View: Grade I Tube type: Oral Tube size: 7.5 mm Number of attempts: 1 Airway Equipment and Method: Stylet and Oral airway Placement Confirmation: ETT inserted through vocal cords under direct vision, positive ETCO2 and breath sounds checked- equal and bilateral Secured at: 24 cm Tube secured with: Tape Dental Injury: Teeth and Oropharynx as per pre-operative assessment

## 2022-01-06 NOTE — H&P (Addendum)
Terry Conley is an 66 y.o. male.   Chief Complaint: Neck pain balance difficulty HPI: 66 year old gentleman progressive worsening neck pain numbness tingling his hands weakness in his hands and balance difficulty.  Work-up revealed cervical spondylosis with stenosis C3-4 and C4-5 with cord compression at both those levels.  Due to the patient's progression of clinical syndrome imaging findings and failed conservative treatment I recommended anterior cervical discectomy and fusion at those 2 levels.  I extensively went over the risks and benefits of that operation with him as well as perioperative course expectations of outcome and alternatives of surgery and he understands and agrees to proceed forward.  Past Medical History:  Diagnosis Date   CAD (coronary artery disease)    a. nonobstructive by cath 2019.   Cancer [facility])    prostate   CKD (chronic kidney disease), stage II    Diabetes ([place])    Dilated aortic root ([place])    a. 4cm by CT 2019.   ED (erectile dysfunction)    Fatty liver    by imaging   GERD (gastroesophageal reflux disease)    History of kidney stones    Hypertension     Past Surgical History:  Procedure Laterality Date   APPENDECTOMY  1961   CATARACT EXTRACTION, BILATERAL  2016   hammertoe repair Right 2016   LEFT HEART CATH AND CORONARY ANGIOGRAPHY N/A 08/27/2018   Procedure: LEFT HEART CATH AND CORONARY ANGIOGRAPHY;  Surgeon: Troy Sine, MD;  Location: [facility];  Service: Cardiovascular;  Laterality: N/A;   NASAL SINUS SURGERY  2000   deviated septum, polyp removal   SHOULDER SURGERY Left 2007    Family History  Problem Relation Age of Onset   Breast cancer Paternal Grandmother    Hypertension Mother    Hypertension Father    Social History:  reports that he quit smoking about 15 years ago. His smoking use included cigarettes. He has a 22.50 pack-year smoking history. He has never used smokeless tobacco. He reports current alcohol use. He  reports that he does not use drugs.  Allergies:  Allergies  Allergen Reactions   Amlodipine     Bottomed out BP   Ezetimibe     Zetia - made pt feel like he had the flu   Metformin Diarrhea   Statins     Joint pain    Sulfa Antibiotics Rash    Medications Prior to Admission  Medication Sig Dispense Refill   Ascorbic Acid (VITAMIN C) 1000 MG tablet Take 1,000 mg by mouth daily.     cholecalciferol (VITAMIN D3) 25 MCG (1000 UNIT) tablet Take 1,000 Units by mouth daily.     fexofenadine (ALLEGRA) 180 MG tablet Take 180 mg by mouth daily.     finasteride (PROSCAR) 5 MG tablet Take 5 mg by mouth daily.     fluticasone (FLONASE) 50 MCG/ACT nasal spray Place 2 sprays into both nostrils daily.     glipiZIDE (GLUCOTROL XL) 2.5 MG 24 hr tablet Take 2.5 mg by mouth daily with breakfast.     L-Arginine 500 MG CAPS Take 500 mg by mouth in the morning and at bedtime.     lisinopril (ZESTRIL) 40 MG tablet Take 40 mg by mouth in the morning and at bedtime.     Magnesium Cl-Calcium Carbonate (SLOW-MAG PO) Take 2 tablets by mouth in the morning and at bedtime.     omeprazole (PRILOSEC) 20 MG capsule Take 20 mg by mouth daily.  vitamin B-12 (CYANOCOBALAMIN) 1000 MCG tablet Take 1,000 mcg by mouth daily.      Results for orders placed or performed during the hospital encounter of 01/06/22 (from the past 48 hour(s))  Glucose, capillary     Status: Abnormal   Collection Time: 01/06/22  8:07 AM  Result Value Ref Range   Glucose-Capillary 150 (H) 70 - 99 mg/dL    Comment: Glucose reference range applies only to samples taken after fasting for at least 8 hours.  ABO/Rh     Status: None   Collection Time: 01/06/22  8:28 AM  Result Value Ref Range   ABO/RH(D)      B POS Performed at [facility] [address] [place], [place] [address]    No results found.  Review of Systems  Musculoskeletal:  Positive for neck pain.  Neurological:  Positive for weakness and numbness.   Blood  pressure (!) 169/119, pulse 91, temperature 98.7 F (37.1 C), temperature source Oral, resp. rate 18, height 6\' 1"  (1.854 m), weight 106.6 kg, SpO2 95 %. Physical Exam HENT:     Head: Normocephalic.     Right Ear: Tympanic membrane normal.     Nose: Nose normal.  Eyes:     Pupils: Pupils are equal, round, and reactive to light.  Cardiovascular:     Rate and Rhythm: Normal rate.  Pulmonary:     Effort: Pulmonary effort is normal.  Abdominal:     General: Abdomen is flat.  Musculoskeletal:        General: Normal range of motion.     Cervical back: Normal range of motion.  Skin:    General: Skin is warm.  Neurological:     Mental Status: He is alert.     Comments: Patient is awake and alert strength is 5 out of 5 deltoid, tricep, bicep, wrist flexion, wrist extension, hand intrinsics on right.  4/5 weaknes triceps left and 4+/5 bicep and grip on right.   lower extremity strength is also 5 out of 5     Assessment/Plan 66 year old male presents for ACDF C3-4 C4-5  Elaina Hoops, MD 01/06/2022, 10:45 AM

## 2022-01-06 NOTE — Progress Notes (Signed)
Patient's B/P elevated, Dr. Lanetta Inch aware. Will continue to monitor.

## 2022-01-06 NOTE — Transfer of Care (Signed)
Immediate Anesthesia Transfer of Care Note  Patient: Terry Conley  Procedure(s) Performed: Anterior Cervical Decompression Fusion Cervical three-four, Cervical four-five (Neck)  Patient Location: PACU  Anesthesia Type:General  Level of Consciousness: oriented, drowsy and patient cooperative  Airway & Oxygen Therapy: Patient Spontanous Breathing and Patient connected to nasal cannula oxygen  Post-op Assessment: Report given to RN and Post -op Vital signs reviewed and stable  Post vital signs: Reviewed  Last Vitals:  Vitals Value Taken Time  BP 158/101 01/06/22 1415  Temp 36.6 C 01/06/22 1415  Pulse 88 01/06/22 1419  Resp 18 01/06/22 1419  SpO2 96 % 01/06/22 1419  Vitals shown include unvalidated device data.  Last Pain:  Vitals:   01/06/22 1415  TempSrc:   PainSc: 0-No pain      Patients Stated Pain Goal: 4 (10/24/24 3664)  Complications: No notable events documented.

## 2022-01-06 NOTE — Anesthesia Postprocedure Evaluation (Signed)
Anesthesia Post Note  Patient: Terry Conley  Procedure(s) Performed: Anterior Cervical Decompression Fusion Cervical three-four, Cervical four-five (Neck)     Patient location during evaluation: PACU Anesthesia Type: General Level of consciousness: awake and alert Pain management: pain level controlled Vital Signs Assessment: post-procedure vital signs reviewed and stable Respiratory status: spontaneous breathing, nonlabored ventilation, respiratory function stable and patient connected to nasal cannula oxygen Cardiovascular status: blood pressure returned to baseline and stable Postop Assessment: no apparent nausea or vomiting Anesthetic complications: no   No notable events documented.  Last Vitals:  Vitals:   01/06/22 1445 01/06/22 1514  BP: (!) 152/100 (!) 171/104  Pulse: 88 86  Resp: 16 18  Temp: 36.6 C 36.4 C  SpO2: 92% 95%    Last Pain:  Vitals:   01/06/22 1625  TempSrc:   PainSc: 3                  Louisiana Searles L Tanica Gaige

## 2022-01-07 ENCOUNTER — Encounter (HOSPITAL_COMMUNITY): Payer: Self-pay | Admitting: Neurosurgery

## 2022-01-07 DIAGNOSIS — M4722 Other spondylosis with radiculopathy, cervical region: Secondary | ICD-10-CM | POA: Diagnosis not present

## 2022-01-07 LAB — GLUCOSE, CAPILLARY: Glucose-Capillary: 155 mg/dL — ABNORMAL HIGH (ref 70–99)

## 2022-01-07 MED ORDER — HYDROCODONE-ACETAMINOPHEN 5-325 MG PO TABS: 1.0000 | ORAL_TABLET | ORAL | 0 refills | Status: AC | PRN

## 2022-01-07 MED ORDER — METHOCARBAMOL 500 MG PO TABS: 500.0000 mg | ORAL_TABLET | Freq: Four times a day (QID) | ORAL | 0 refills | Status: DC

## 2022-01-07 NOTE — Discharge Instructions (Signed)

## 2022-01-07 NOTE — Plan of Care (Signed)

## 2022-01-07 NOTE — Evaluation (Signed)
Physical Therapy Evaluation and Discharge Patient Details Name: Terry Conley MRN: [id number] DOB: 1956-04-04 Today's Date: 01/07/2022  History of Present Illness  Pt is a 66 y.o. male presenting for elective C3-C5 ACDF on 01/06/2022. PMH significant for CAD s/p L heart cath and coronary angio, Hx L shoulder surgery, prostate cancer, CKD, DMII, GERD and HTN.   Clinical Impression  Patient evaluated by Physical Therapy with no further acute PT needs identified. All education has been completed and the patient has no further questions. Pt was able to demonstrate transfers and ambulation with gross modified independence and no AD. Pt was educated on precautions, positioning recommendations, appropriate activity progression, and car transfer. See below for any follow-up Physical Therapy or equipment needs. PT is signing off. Thank you for this referral.        Recommendations for follow up therapy are one component of a multi-disciplinary discharge planning process, led by the attending physician.  Recommendations may be updated based on patient status, additional functional criteria and insurance authorization.  Follow Up Recommendations No PT follow up    Assistance Recommended at Discharge PRN  Patient can return home with the following       Equipment Recommendations None recommended by PT  Recommendations for Other Services       Functional Status Assessment Patient has had a recent decline in their functional status and demonstrates the ability to make significant improvements in function in a reasonable and predictable amount of time.     Precautions / Restrictions Precautions Precautions: Cervical Precaution Booklet Issued: Yes (comment) Precaution Comments: Written and verbal education provided. Required Braces or Orthoses:  (No brace needed) Restrictions Weight Bearing Restrictions: No      Mobility  Bed Mobility Overal bed mobility: Modified Independent              General bed mobility comments: Reviewed log roll technique verbally    Transfers Overall transfer level: Modified independent Equipment used: None               General transfer comment: Increased time. No assist required from chair without arm rests    Ambulation/Gait Ambulation/Gait assistance: Modified independent (Device/Increase time) Gait Distance (Feet): 400 Feet Assistive device: None Gait Pattern/deviations: Step-through pattern;Decreased stride length Gait velocity: Dectreased Gait velocity interpretation: 1.31 - 2.62 ft/sec, indicative of limited community ambulator   General Gait Details: Mildly decreased gait speed without unsteadiness or LOB.  Stairs            Wheelchair Mobility    Modified Rankin (Stroke Patients Only)       Balance Overall balance assessment: No apparent balance deficits (not formally assessed)                                           Pertinent Vitals/Pain Pain Assessment: Faces Pain Score: 3  Faces Pain Scale: Hurts little more Pain Location: Cervical neck (incisional) Pain Descriptors / Indicators: Aching;Sore Pain Intervention(s): Limited activity within patient's tolerance;Monitored during session;Repositioned;Ice applied    Home Living Family/patient expects to be discharged to:: Private residence Living Arrangements: Spouse/significant other Available Help at Discharge: Family;Available 24 hours/day Type of Home: House Home Access: Stairs to enter Entrance Stairs-Rails: None Entrance Stairs-Number of Steps: 1   Home Layout: One level Home Equipment: Cane - single point Additional Comments: Has access to other DME via sister.    Prior Function Prior  Level of Function : Independent/Modified Independent;Driving                     Hand Dominance   Dominant Hand: Right    Extremity/Trunk Assessment   Upper Extremity Assessment Upper Extremity Assessment: Defer to OT  evaluation RUE Deficits / Details: WFL RUE Sensation: WNL RUE Coordination: WNL LUE Deficits / Details: Reports numbness/tingling PTA. Has since resolved. No apparent strength deficits although not formally assess 2/2 cervical precautions. Gross grasp 5/5. LUE Sensation: WNL LUE Coordination: WNL    Lower Extremity Assessment Lower Extremity Assessment: Overall WFL for tasks assessed    Cervical / Trunk Assessment Cervical / Trunk Assessment: Neck Surgery  Communication   Communication: No difficulties  Cognition Arousal/Alertness: Awake/alert Behavior During Therapy: WFL for tasks assessed/performed Overall Cognitive Status: Within Functional Limits for tasks assessed                                          General Comments General comments (skin integrity, edema, etc.): Wife present at bedside.    Exercises     Assessment/Plan    PT Assessment Patient does not need any further PT services  PT Problem List         PT Treatment Interventions      PT Goals (Current goals can be found in the Care Plan section)  Acute Rehab PT Goals Patient Stated Goal: Home today PT Goal Formulation: All assessment and education complete, DC therapy    Frequency       Co-evaluation               AM-PAC PT "6 Clicks" Mobility  Outcome Measure Help needed turning from your back to your side while in a flat bed without using bedrails?: None Help needed moving from lying on your back to sitting on the side of a flat bed without using bedrails?: None Help needed moving to and from a bed to a chair (including a wheelchair)?: None Help needed standing up from a chair using your arms (e.g., wheelchair or bedside chair)?: None Help needed to walk in hospital room?: None Help needed climbing 3-5 steps with a railing? : A Little 6 Click Score: 23    End of Session   Activity Tolerance: Patient tolerated treatment well Patient left: in chair;with call bell/phone  within reach;with family/visitor present Nurse Communication: Mobility status PT Visit Diagnosis: Unsteadiness on feet (R26.81);Pain Pain - part of body:  (neck)    Time: 1610-9604 PT Time Calculation (min) (ACUTE ONLY): 15 min   Charges:   PT Evaluation $PT Eval Low Complexity: 1 Low          Rolinda Roan, PT, DPT Acute Rehabilitation Services Pager: [phone] Office: [phone]   Thelma Comp 01/07/2022, 10:41 AM

## 2022-01-07 NOTE — Progress Notes (Signed)
Patient awaiting transport via wheelchair by volunteer for discharge home; in no acute distress nor complaints of pain nor discomfort; incision on his anterior neck with honeycomb dressing and is clean, dry and intact; room was checked and accounted for all his belongings; discharge instructions concerning his medications, follow up appointment, incision care and when to call the doctor as needed were all discussed with patient and his wife and both expressed understanding on the instructions given.

## 2022-01-07 NOTE — Discharge Summary (Signed)
Physician Discharge Summary  Patient ID: Terry Conley MRN: [id number] DOB/AGE: 1956-07-27 66 y.o.  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 The patient tolerated the procedure well and was taken to the recovery room and then to the floor in stable condition. The hospital course was routine. There were no complications. The wound remained clean dry and intact. Pt had appropriate neck soreness. No complaints of arm pain or new N/T/W. The patient remained afebrile with stable vital signs, and tolerated a regular diet. The patient continued to increase activities, and pain was well controlled with oral pain medications.   Consults: None  Significant Diagnostic Studies:  Results for orders placed or performed during the hospital encounter of 01/06/22  Glucose, capillary  Result Value Ref Range   Glucose-Capillary 150 (H) 70 - 99 mg/dL  Glucose, capillary  Result Value Ref Range   Glucose-Capillary 116 (H) 70 - 99 mg/dL  Glucose, capillary  Result Value Ref Range   Glucose-Capillary 150 (H) 70 - 99 mg/dL  Glucose, capillary  Result Value Ref Range   Glucose-Capillary 242 (H) 70 - 99 mg/dL  Glucose, capillary  Result Value Ref Range   Glucose-Capillary 259 (H) 70 - 99 mg/dL   Comment 1 Notify RN    Comment 2 Document in Chart   Glucose, capillary  Result Value Ref Range   Glucose-Capillary 155 (H) 70 - 99 mg/dL   Comment 1 Notify RN    Comment 2 Document in Chart   ABO/Rh  Result Value Ref Range   ABO/RH(D)      B POS Performed at [facility], [address] [place], [place] [address]     DG Cervical Spine 1 View  Result Date: 01/06/2022 CLINICAL DATA:   C3-C5 ACDF EXAM: DG CERVICAL SPINE - 1 VIEW; DG C-ARM 1-60 MIN-NO REPORT COMPARISON:  12/17/2021 FINDINGS: Single lateral fluoroscopic image of the cervical spine was obtained intraoperatively demonstrating ACDF hardware at C3-C5. Hardware appears well seated. An ET tube is present. 6 seconds of fluoroscopy time was utilized. IMPRESSION: Intraoperative fluoroscopy during C3-C5 ACDF. Electronically Signed   By: Davina Poke D.O.   On: 01/06/2022 15:08   MR CERVICAL SPINE WO CONTRAST  Result Date: 12/17/2021 CLINICAL DATA:  Left arm muscle mass loss for the past 6 months. EXAM: MRI CERVICAL SPINE WITHOUT CONTRAST TECHNIQUE: Multiplanar, multisequence MR imaging of the cervical spine was performed. No intravenous contrast was administered. COMPARISON:  None. FINDINGS: Alignment: Physiologic. Vertebrae: No fracture, evidence of discitis, or bone lesion. Cord: Normal signal and morphology. Posterior Fossa, vertebral arteries, paraspinal tissues: Negative. Disc levels: C2-C3: Mild disc bulging and moderate bilateral facet arthropathy. No stenosis. C3-C4: Bulky posterior disc osteophyte complex with flattening of the ventral cord. Moderate bilateral uncovertebral hypertrophy. Mild bilateral facet arthropathy. Moderate spinal canal stenosis. Severe bilateral neuroforaminal stenosis. C4-C5: Small central posterior disc osteophyte complex. Moderate bilateral uncovertebral hypertrophy and left facet arthropathy. Mild spinal canal stenosis. Moderate bilateral neuroforaminal stenosis. C5-C6: Small posterior disc osteophyte complex eccentric to the left. Mild bilateral facet uncovertebral hypertrophy. Mild left greater than right neuroforaminal stenosis. No spinal canal stenosis. C6-C7: Small broad-based posterior disc osteophyte complex and mild bilateral uncovertebral hypertrophy. Mild right neuroforaminal stenosis. No spinal  canal or left neuroforaminal stenosis. C7-T1:  Small shallow central disc protrusion.  No  stenosis. IMPRESSION: 1. Multilevel degenerative changes of the cervical spine as described above, worst at C3-C4 where there is moderate spinal canal stenosis and severe bilateral neuroforaminal stenosis. 2. Mild spinal canal and moderate bilateral neuroforaminal stenosis at C4-C5. Electronically Signed   By: Titus Dubin M.D.   On: 12/17/2021 15:49   DG C-Arm 1-60 Min-No Report  Result Date: 01/06/2022 CLINICAL DATA:  C3-C5 ACDF EXAM: DG CERVICAL SPINE - 1 VIEW; DG C-ARM 1-60 MIN-NO REPORT COMPARISON:  12/17/2021 FINDINGS: Single lateral fluoroscopic image of the cervical spine was obtained intraoperatively demonstrating ACDF hardware at C3-C5. Hardware appears well seated. An ET tube is present. 6 seconds of fluoroscopy time was utilized. IMPRESSION: Intraoperative fluoroscopy during C3-C5 ACDF. Electronically Signed   By: Davina Poke D.O.   On: 01/06/2022 15:08   DG C-Arm 1-60 Min-No Report  Result Date: 01/06/2022 CLINICAL DATA:  C3-C5 ACDF EXAM: DG CERVICAL SPINE - 1 VIEW; DG C-ARM 1-60 MIN-NO REPORT COMPARISON:  12/17/2021 FINDINGS: Single lateral fluoroscopic image of the cervical spine was obtained intraoperatively demonstrating ACDF hardware at C3-C5. Hardware appears well seated. An ET tube is present. 6 seconds of fluoroscopy time was utilized. IMPRESSION: Intraoperative fluoroscopy during C3-C5 ACDF. Electronically Signed   By: Davina Poke D.O.   On: 01/06/2022 15:08    Antibiotics:  Anti-infectives (From admission, onward)    Start     Dose/Rate Route Frequency Ordered Stop   01/06/22 2000  ceFAZolin (ANCEF) IVPB 2g/100 mL premix        2 g 200 mL/hr over 30 Minutes Intravenous Every 8 hours 01/06/22 1506 01/07/22 0434   01/06/22 0826  ceFAZolin (ANCEF) 2-4 GM/100ML-% IVPB       Note to Pharmacy: Gleason, Ginger E: cabinet override      01/06/22 0826 01/06/22 2029       Discharge Exam: Blood pressure 134/84, pulse 98, temperature 98.1 F (36.7 C), temperature source  Oral, resp. rate 18, height 6\' 1"  (1.854 m), weight 106.6 kg, SpO2 93 %. Neurologic: Grossly normal Ambulating and voiding well  Discharge Medications:   Allergies as of 01/07/2022       Reactions   Alpha-gal    Amlodipine    Bottomed out BP   Ezetimibe    Zetia - made pt feel like he had the flu   Metformin Diarrhea   Statins    Joint pain    Sulfa Antibiotics Rash        Medication List     TAKE these medications    cholecalciferol 25 MCG (1000 UNIT) tablet Commonly known as: VITAMIN D3 Take 1,000 Units by mouth daily.   fexofenadine 180 MG tablet Commonly known as: ALLEGRA Take 180 mg by mouth daily.   finasteride 5 MG tablet Commonly known as: PROSCAR Take 5 mg by mouth daily.   fluticasone 50 MCG/ACT nasal spray Commonly known as: FLONASE Place 2 sprays into both nostrils daily.   glipiZIDE 2.5 MG 24 hr tablet Commonly known as: GLUCOTROL XL Take 2.5 mg by mouth daily with breakfast.   HYDROcodone-acetaminophen 5-325 MG tablet Commonly known as: NORCO/VICODIN Take 1 tablet by mouth every 4 (four) hours as needed for moderate pain.   L-Arginine 500 MG Caps Take 500 mg by mouth in the morning and at bedtime.   lisinopril 40 MG tablet Commonly known as: ZESTRIL Take 40 mg by mouth in the morning and at bedtime.  methocarbamol 500 MG tablet Commonly known as: Robaxin Take 1 tablet (500 mg total) by mouth 4 (four) times daily.   omeprazole 20 MG capsule Commonly known as: PRILOSEC Take 20 mg by mouth daily.   SLOW-MAG PO Take 2 tablets by mouth in the morning and at bedtime.   vitamin B-12 1000 MCG tablet Commonly known as: CYANOCOBALAMIN Take 1,000 mcg by mouth daily.   vitamin C 1000 MG tablet Take 1,000 mg by mouth daily.        Disposition: home   Final Dx: acdf C3-4, C4-5  Discharge Instructions      Remove dressing in 72 hours   Complete by: As directed    Call MD for:  difficulty breathing, headache or visual disturbances    Complete by: As directed    Call MD for:  hives   Complete by: As directed    Call MD for:  persistant dizziness or light-headedness   Complete by: As directed    Call MD for:  persistant nausea and vomiting   Complete by: As directed    Call MD for:  redness, tenderness, or signs of infection (pain, swelling, redness, odor or green/yellow discharge around incision site)   Complete by: As directed    Call MD for:  severe uncontrolled pain   Complete by: As directed    Call MD for:  temperature >100.4   Complete by: As directed    Diet - low sodium heart healthy   Complete by: As directed    Increase activity slowly   Complete by: As directed           Signed: Ocie Cornfield Fahd Galea 01/07/2022, 8:08 AM

## 2022-01-07 NOTE — Evaluation (Signed)
Occupational Therapy Evaluation Patient Details Name: Terry Conley MRN: [id number] DOB: [birth year] Today's Date: 01/07/2022   History of Present Illness 66 y.o. male presenting for elective C3-4 and C4-5 ACDF. PMHx significant for CAD s/p L heart cath and coronary angio, Hx L shoulder surgery, prostate cancer, CKD, DMII, GERD and HTN.   Clinical Impression   PTA patient was living with his spouse and was independent with ADLs/IADLs without AD. Patient currently presents near baseline level of function demonstrating observed ADLs with I to Mod I. OT provided education on spinal precautions, home set-up to maximize safety and independence with self-care tasks, and acquisition/use of AE. Patient expressed verbal understanding. Patient does not require continued acute occupational therapy services with OT to sign off at this time.        Recommendations for follow up therapy are one component of a multi-disciplinary discharge planning process, led by the attending physician.  Recommendations may be updated based on patient status, additional functional criteria and insurance authorization.   Follow Up Recommendations  No OT follow up    Assistance Recommended at Discharge None  Patient can return home with the following      Functional Status Assessment  Patient has not had a recent decline in their functional status  Equipment Recommendations  None recommended by OT    Recommendations for Other Services       Precautions / Restrictions Precautions Precautions: Cervical Precaution Booklet Issued: Yes (comment) Precaution Comments: Written and verbal education provided. Required Braces or Orthoses:  (No brace needed) Restrictions Weight Bearing Restrictions: No      Mobility Bed Mobility Overal bed mobility: Modified Independent             General bed mobility comments: After education on cervical neck precautions.    Transfers Overall transfer level: Independent                         Balance Overall balance assessment: No apparent balance deficits (not formally assessed)                                         ADL either performed or assessed with clinical judgement   ADL Overall ADL's : Independent                                             Vision Baseline Vision/History: 0 No visual deficits Ability to See in Adequate Light: 0 Adequate Patient Visual Report: No change from baseline Vision Assessment?: No apparent visual deficits     Perception     Praxis      Pertinent Vitals/Pain Pain Assessment: 0-10 Pain Score: 3  Pain Location: Cervical neck (incisional) Pain Descriptors / Indicators: Aching;Sore Pain Intervention(s): Monitored during session     Hand Dominance Right   Extremity/Trunk Assessment Upper Extremity Assessment Upper Extremity Assessment: RUE deficits/detail;LUE deficits/detail RUE Deficits / Details: WFL RUE Sensation: WNL RUE Coordination: WNL LUE Deficits / Details: Reports numbness/tingling PTA. Has since resolved. No apparent strength deficits although not formally assess 2/2 cervical precautions. Gross grasp 5/5. LUE Sensation: WNL LUE Coordination: WNL   Lower Extremity Assessment Lower Extremity Assessment: Overall WFL for tasks assessed   Cervical / Trunk Assessment Cervical / Trunk Assessment: Neck Surgery  Communication Communication Communication: No difficulties   Cognition Arousal/Alertness: Awake/alert Behavior During Therapy: WFL for tasks assessed/performed Overall Cognitive Status: Within Functional Limits for tasks assessed                                       General Comments  Wife present at bedside.    Exercises     Shoulder Instructions      Home Living Family/patient expects to be discharged to:: Private residence Living Arrangements: Spouse/significant other Available Help at Discharge: Family;Available  24 hours/day Type of Home: House Home Access: Stairs to enter [employer] of Steps: 1 Entrance Stairs-Rails: None Home Layout: One level     Bathroom Shower/Tub: Tub/shower unit;Walk-in shower   Bathroom Toilet: Standard     Home Equipment: Cane - single point   Additional Comments: Has access to other DME via sister.      Prior Functioning/Environment Prior Level of Function : Independent/Modified Independent;Driving                        OT Problem List: Pain      OT Treatment/Interventions:      OT Goals(Current goals can be found in the care plan section) Acute Rehab OT Goals Patient Stated Goal: To return home today. OT Goal Formulation: With patient  OT Frequency:      Co-evaluation              AM-PAC OT "6 Clicks" Daily Activity     Outcome Measure Help from another person eating meals?: None Help from another person taking care of personal grooming?: None Help from another person toileting, which includes using toliet, bedpan, or urinal?: None Help from another person bathing (including washing, rinsing, drying)?: None Help from another person to put on and taking off regular upper body clothing?: None Help from another person to put on and taking off regular lower body clothing?: None 6 Click Score: 24   End of Session Nurse Communication: Mobility status  Activity Tolerance: Patient tolerated treatment well Patient left: in chair;with call bell/phone within reach;with family/visitor present  OT Visit Diagnosis: Pain Pain - Right/Left: Right Pain - part of body:  (cervical neck)                Time: 8270-7867 OT Time Calculation (min): 15 min Charges:  OT General Charges $OT Visit: 1 Visit OT Evaluation $OT Eval Low Complexity: 1 Low  Rogena Deupree H. OTR/L Supplemental OT, Department of rehab services [phone]  Mckala Pantaleon R H. 01/07/2022, 8:32 AM

## 2022-01-14 ENCOUNTER — Ambulatory Visit (HOSPITAL_COMMUNITY): Payer: Medicare Other

## 2022-06-25 ENCOUNTER — Ambulatory Visit: Payer: Medicare Other | Admitting: Neurology

## 2022-07-06 NOTE — Progress Notes (Unsigned)
NEUROLOGY FOLLOW UP OFFICE NOTE  Terry Conley [id number]  Assessment/Plan:   Unsteady gait Weakness - proximal  At this time, I would first evaluate for underlying cervical myelopathy.  If unremarkable, then I would focus of a myopathy or NMJ disorder.  At this time, I do not suspect a neurodegenerative disease. 3.  Benign paroxysmal positional vertigo, resolved   MRI of cervical spine without contrast Check serum sed rate, CK, aldolase, TSH, Myasthenia gravis panel with MuSK reflex. If MRI unremarkable, then would pursue NCV-EMG Further recommendations pending results.  Follow up afterwards.     Subjective:  Terry Conley is a 66 year old male with CAD, CKD, DM II, and HTN who follows up for balance disorder and vertigo.  UPDATE: On neurologic exam, he was noted to have proximal extremity weakness with cautious gait.  Labs on 11/18/2021 showed sed rate 13, CK 75, aldolase 5.4, TSH 2.23, AChr binding antibody and anti-MuSK antibody negative.  MRI of cervical spine on 12/17/2021 personally reviewed showed multilevel degenerative changes worse at C3-4 where there is moderate spinal canal stenosis and severe bilateral neuroforaminal stenosis.  He was referred to neurosurgery ***   HISTORY: In 2021, he had left hip replacement.  Since then, he reports some balance problems.  In March, he had episode of vertigo with worsening balance.  He had an MRI of the brain without contrast on 03/07/2021 which showed generalized volume loss but no acute intracranial abnormality.  He once used a scopolamine patch, but had to take it off because it made him confused.  He went to PT for balance.  Vertigo resolved but he started noticing worsening balance problems.  When walking, he feels himself veering to either side.  Sometimes he has to consciously keep his upper body upright or he will be unable to stop from walking.  He also reports weakness in his extremities.  He sometimes has tremor in the hand when  he is eating or drinking from a cup.  When he stands up from a chair, he has to push up with his arms.  Once he is standing upright, he feels like he may fall backwards. Denies double vision, dysphagia and shortness of breath.  Denies numbness and weakness.  He has some urinary incontinence possibly related to prostate cancer.  PAST MEDICAL HISTORY: Past Medical History:  Diagnosis Date   CAD (coronary artery disease)    a. nonobstructive by cath 2019.   Cancer [facility])    prostate   CKD (chronic kidney disease), stage II    Diabetes ([place])    Dilated aortic root ([place])    a. 4cm by CT 2019.   ED (erectile dysfunction)    Fatty liver    by imaging   GERD (gastroesophageal reflux disease)    History of kidney stones    Hypertension     MEDICATIONS: Current Outpatient Medications on File Prior to Visit  Medication Sig Dispense Refill   Ascorbic Acid (VITAMIN C) 1000 MG tablet Take 1,000 mg by mouth daily.     cholecalciferol (VITAMIN D3) 25 MCG (1000 UNIT) tablet Take 1,000 Units by mouth daily.     fexofenadine (ALLEGRA) 180 MG tablet Take 180 mg by mouth daily.     finasteride (PROSCAR) 5 MG tablet Take 5 mg by mouth daily.     fluticasone (FLONASE) 50 MCG/ACT nasal spray Place 2 sprays into both nostrils daily.     glipiZIDE (GLUCOTROL XL) 2.5 MG 24 hr tablet Take 2.5 mg by  mouth daily with breakfast.     HYDROcodone-acetaminophen (NORCO/VICODIN) 5-325 MG tablet Take 1 tablet by mouth every 4 (four) hours as needed for moderate pain. 20 tablet 0   L-Arginine 500 MG CAPS Take 500 mg by mouth in the morning and at bedtime.     lisinopril (ZESTRIL) 40 MG tablet Take 40 mg by mouth in the morning and at bedtime.     Magnesium Cl-Calcium Carbonate (SLOW-MAG PO) Take 2 tablets by mouth in the morning and at bedtime.     methocarbamol (ROBAXIN) 500 MG tablet Take 1 tablet (500 mg total) by mouth 4 (four) times daily. 45 tablet 0   omeprazole (PRILOSEC) 20 MG capsule Take 20 mg by mouth daily.       vitamin B-12 (CYANOCOBALAMIN) 1000 MCG tablet Take 1,000 mcg by mouth daily.     No current facility-administered medications on file prior to visit.    ALLERGIES: Allergies  Allergen Reactions   Alpha-Gal    Amlodipine     Bottomed out BP   Ezetimibe     Zetia - made pt feel like he had the flu   Metformin Diarrhea   Statins     Joint pain    Sulfa Antibiotics Rash    FAMILY HISTORY: Family History  Problem Relation Age of Onset   Breast cancer Paternal Grandmother    Hypertension Mother    Hypertension Father       Objective:  *** General: No acute distress.  Patient appears well-groomed.   Head:  Normocephalic/atraumatic Eyes:  Fundi examined but not visualized Neck: supple, no paraspinal tenderness, full range of motion Heart:  Regular rate and rhythm Lungs:  Clear to auscultation bilaterally Back: No paraspinal tenderness Neurological Exam: alert and oriented to person, place, and time.  Speech fluent and not dysarthric, language intact.  CN II-XII intact. Bulk and tone normal, muscle strength 5/5 throughout.  Sensation to light touch intact.  Deep tendon reflexes 2+ throughout, toes downgoing.  Finger to nose testing intact.  Gait normal, Romberg negative.   Metta Clines, DO  CC: Gar Ponto, MD

## 2022-07-07 ENCOUNTER — Encounter: Payer: Self-pay | Admitting: Neurology

## 2022-07-07 ENCOUNTER — Ambulatory Visit (INDEPENDENT_AMBULATORY_CARE_PROVIDER_SITE_OTHER): Payer: Medicare Other | Admitting: Neurology

## 2022-07-07 VITALS — BP 129/84 | HR 93 | Ht 73.0 in | Wt 236.0 lb

## 2022-07-07 DIAGNOSIS — G959 Disease of spinal cord, unspecified: Secondary | ICD-10-CM

## 2022-11-19 IMAGING — MR MR CERVICAL SPINE W/O CM
4 of 5 series · 28 of 48 positions shown · non-contrast
Comparison: None.

CLINICAL DATA: Left arm muscle mass loss for the past 6 months.

EXAM:
MRI CERVICAL SPINE WITHOUT CONTRAST
TECHNIQUE: Multiplanar, multisequence MR imaging of the cervical spine was
performed. No intravenous contrast was administered.

[Series 2: T2 · sagittal · 3.0mm · 0.66mm/px · 6 of 15 slices shown (1 of 2)]
[im 1/15]
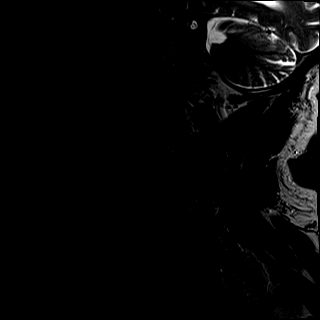
[im 3/15]
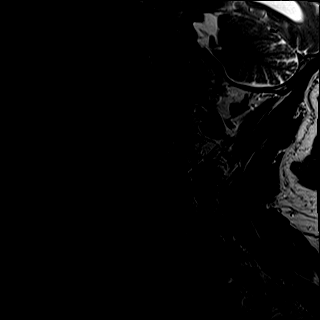
[im 6/15]
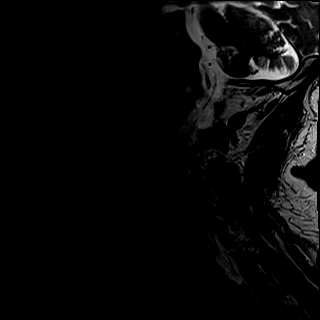
[im 9/15]
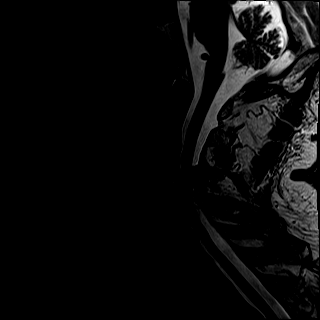
[im 12/15]
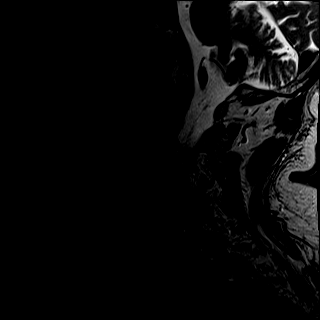
[im 15/15]
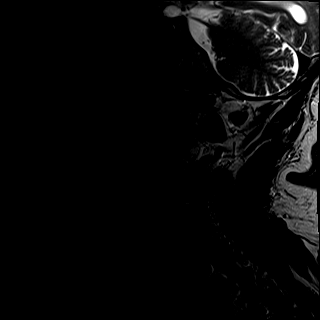

[Series 3: T1 · sagittal · 3.0mm · 0.41mm/px · 7 of 15 slices shown]
[im 1/15]
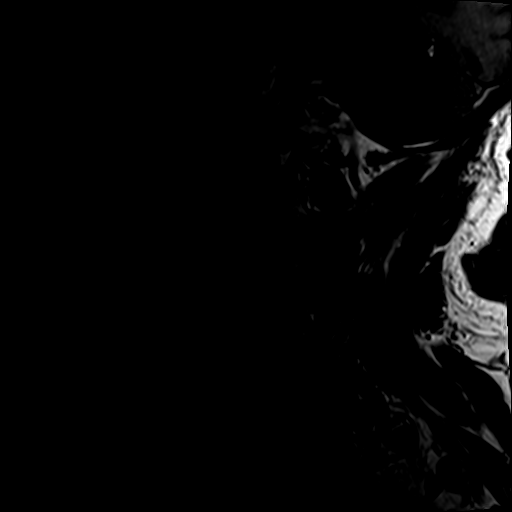
[im 3/15]
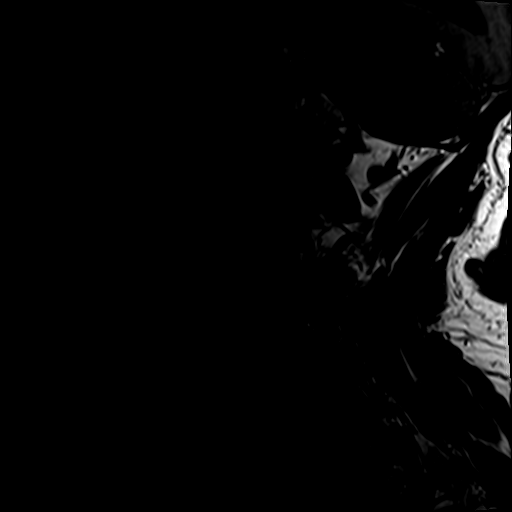
[im 5/15]
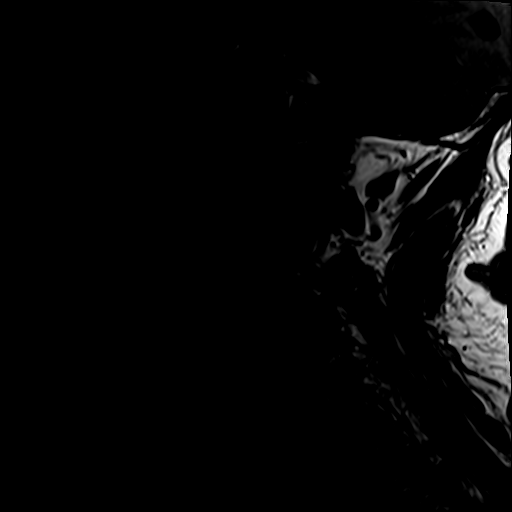
[im 8/15]
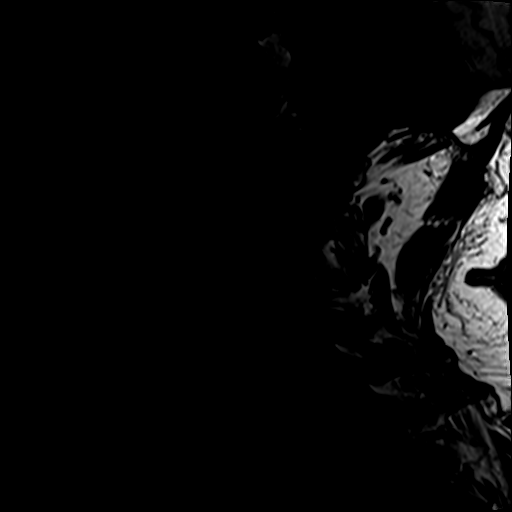
[im 10/15]
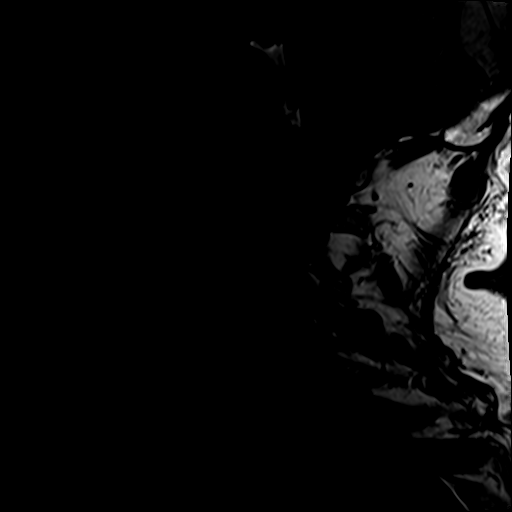
[im 12/15]
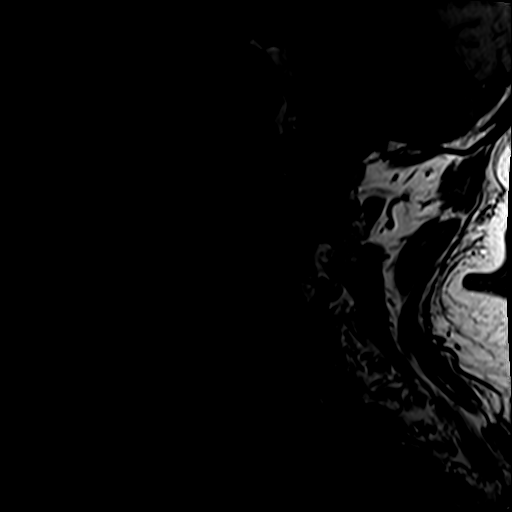
[im 15/15]
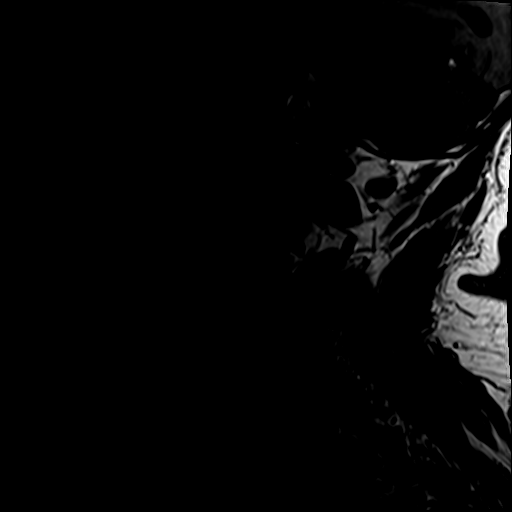

[Series 4: tir sag · sagittal · 3.0mm · 0.41mm/px · 7 of 15 slices shown]
[im 1/15]
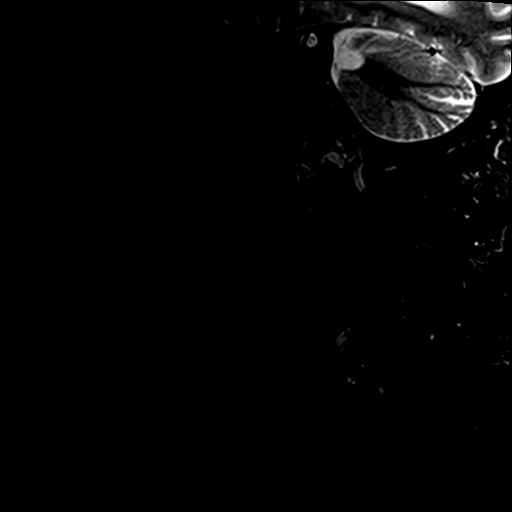
[im 3/15]
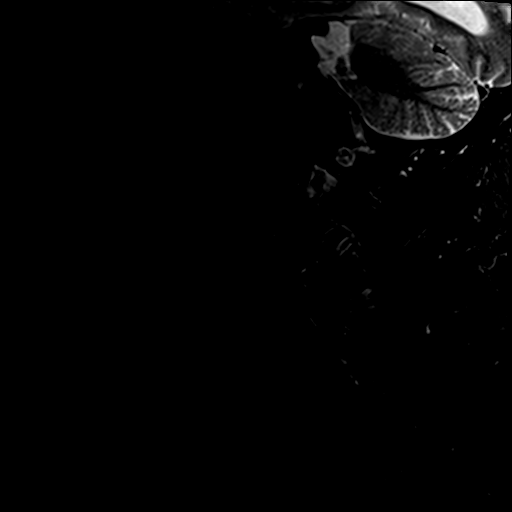
[im 5/15]
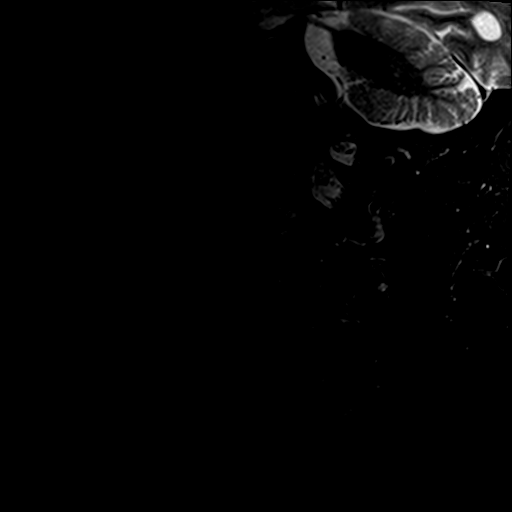
[im 8/15]
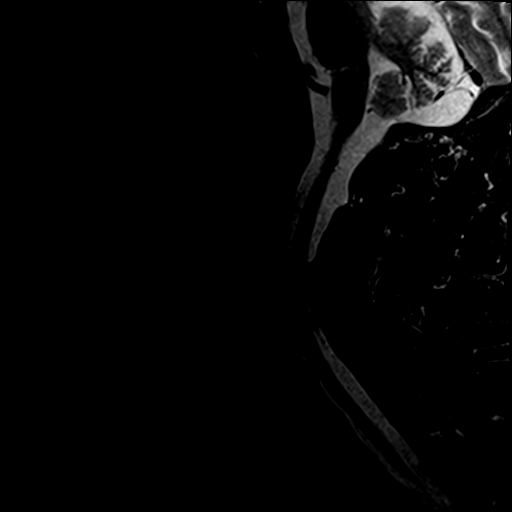
[im 10/15]
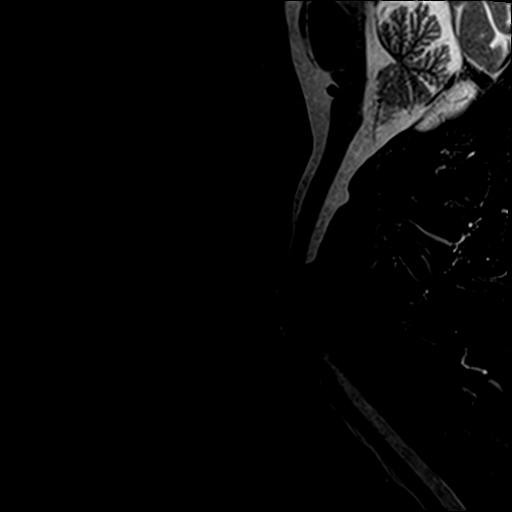
[im 12/15]
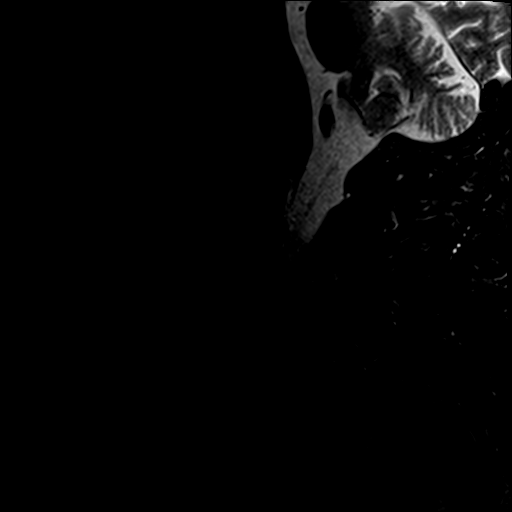
[im 15/15]
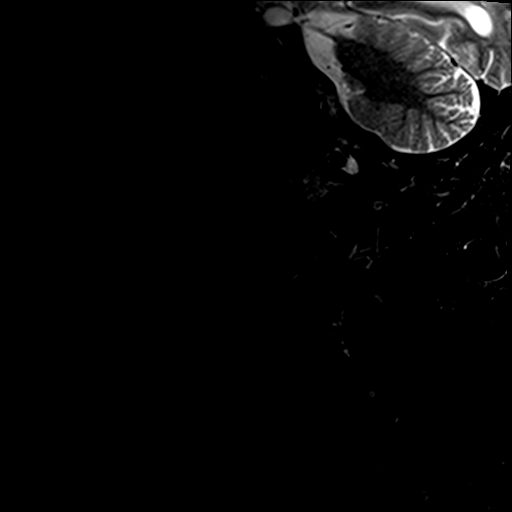

[Series 6: T2 · axial · 3.0mm · 0.70mm/px · z∈[-43,+58]mm · 8 of 29 slices shown (2 of 2)]
[im 1/29]
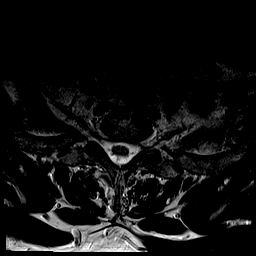
[im 5/29]
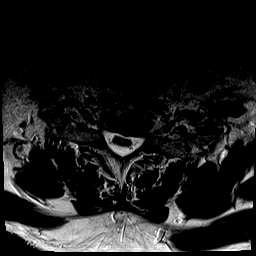
[im 9/29]
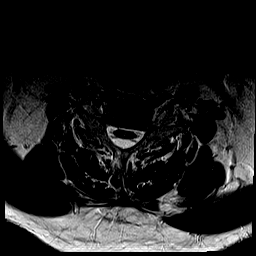
[im 13/29]
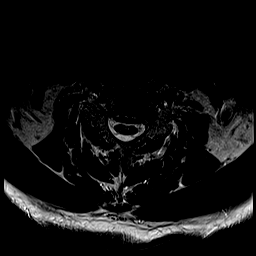
[im 16/29]
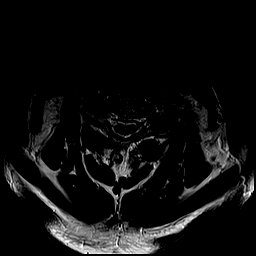
[im 20/29]
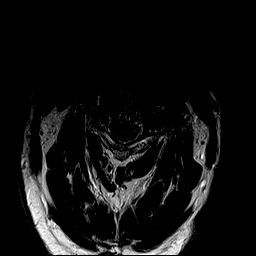
[im 24/29]
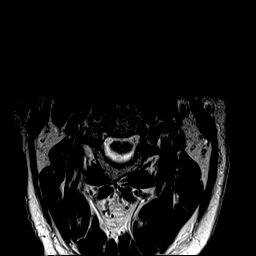
[im 29/29]
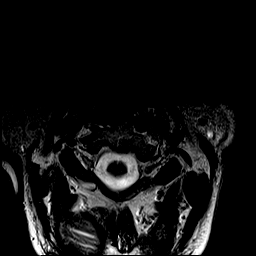

[28 of 48 positions shown; findings below may reference images not displayed]

FINDINGS: Alignment: Physiologic.

Vertebrae: No fracture, evidence of discitis, or bone lesion.

Cord: Normal signal and morphology.

Posterior Fossa, vertebral arteries, paraspinal tissues: Negative.

Disc levels:

C2-C3: Mild disc bulging and moderate bilateral facet arthropathy.
No stenosis.

C3-C4: Bulky posterior disc osteophyte complex with flattening of
the ventral cord. Moderate bilateral uncovertebral hypertrophy. Mild
bilateral facet arthropathy. Moderate spinal canal stenosis. Severe
bilateral neuroforaminal stenosis.

C4-C5: Small central posterior disc osteophyte complex. Moderate
bilateral uncovertebral hypertrophy and left facet arthropathy. Mild
spinal canal stenosis. Moderate bilateral neuroforaminal stenosis.

C5-C6: Small posterior disc osteophyte complex eccentric to the
left. Mild bilateral facet uncovertebral hypertrophy. Mild left
greater than right neuroforaminal stenosis. No spinal canal
stenosis.

C6-C7: Small broad-based posterior disc osteophyte complex and mild
bilateral uncovertebral hypertrophy. Mild right neuroforaminal
stenosis. No spinal canal or left neuroforaminal stenosis.

C7-T1:  Small shallow central disc protrusion.  No stenosis.
IMPRESSION: 1. Multilevel degenerative changes of the cervical spine as
described above, worst at C3-C4 where there is moderate spinal canal
stenosis and severe bilateral neuroforaminal stenosis.
2. Mild spinal canal and moderate bilateral neuroforaminal stenosis
at C4-C5.

## 2023-03-04 ENCOUNTER — Other Ambulatory Visit (HOSPITAL_COMMUNITY): Payer: Self-pay | Admitting: Family Medicine

## 2023-03-04 DIAGNOSIS — M7989 Other specified soft tissue disorders: Secondary | ICD-10-CM

## 2023-03-12 ENCOUNTER — Ambulatory Visit (HOSPITAL_COMMUNITY)
Admission: RE | Admit: 2023-03-12 | Discharge: 2023-03-12 | Disposition: A | Discharge status: Home / Self Care | Payer: Medicare Other | Source: Ambulatory Visit | Attending: Family Medicine | Admitting: Family Medicine

## 2023-03-12 DIAGNOSIS — M7989 Other specified soft tissue disorders: Secondary | ICD-10-CM | POA: Insufficient documentation

## 2024-04-11 ENCOUNTER — Ambulatory Visit: Payer: Self-pay | Admitting: Internal Medicine

## 2024-10-17 ENCOUNTER — Encounter: Payer: Self-pay | Admitting: Cardiology

## 2024-10-17 ENCOUNTER — Ambulatory Visit: Attending: Cardiology | Admitting: Cardiology

## 2024-10-17 ENCOUNTER — Telehealth: Payer: Self-pay | Admitting: Cardiology

## 2024-10-17 VITALS — BP 152/90 | HR 83 | Ht 73.0 in | Wt 243.0 lb

## 2024-10-17 DIAGNOSIS — I2510 Atherosclerotic heart disease of native coronary artery without angina pectoris: Secondary | ICD-10-CM | POA: Diagnosis not present

## 2024-10-17 DIAGNOSIS — R002 Palpitations: Secondary | ICD-10-CM | POA: Insufficient documentation

## 2024-10-17 MED ORDER — ASPIRIN 81 MG PO TBEC: 81.0000 mg | DELAYED_RELEASE_TABLET | Freq: Every day | ORAL | Status: AC

## 2024-10-17 NOTE — Telephone Encounter (Signed)
 Checking percert on the following patient for   1 wk zio - palps

## 2024-10-17 NOTE — Progress Notes (Signed)
 Clinical Summary Terry Conley is a 68 y.o.male former patient of Dr Charls. Last seen by PA Strader 03/2020 in our clinic. Seen today as a new patient  1.CAD - s/p cath in 07/2018 showing mild-nonobstructive CAD  - recs were for ASA, statin at the time.  - has been intolerant to multiple statins, zetia, repatha.    2. HTN -reports he is off bp meds, had some low bp's earlier this year -    3. HLD - listed as having joint pains to statins, did not tolerate zetia - reports tried repatha, reports caused high blood sugars.  -   4.DM2   5. CKD 3   6. Palpitations - from pcp note mentioned some episodes of palpitations - EKG today shows NSR - roughly 15 episodes a week. Most common with activity. Feeling of heart racing, lightheaded, dizzy, SOB. Lasts few minutes. - 1 cup of coffee daily, 2 cans of soda daily diet mtn dew, no tea, no energy drinks. Vodka about 4 days week, 1 drink - symptoms ongoign for at least 1 year. Increaseing in frequency and severity - prior notes mention do question possible inapparopriate sinus tachycardia     Past Medical History:  Diagnosis Date   CAD (coronary artery disease)    a. nonobstructive by cath 2019.   Cancer [facility])    prostate   CKD (chronic kidney disease), stage II    Diabetes (HCC)    Dilated aortic root    a. 4cm by CT 2019.   ED (erectile dysfunction)    Fatty liver    by imaging   GERD (gastroesophageal reflux disease)    History of kidney stones    Hypertension      Allergies  Allergen Reactions   Alpha-Gal    Amlodipine     Bottomed out BP   Ezetimibe     Zetia - made pt feel like he had the flu   Metformin Diarrhea   Statins     Joint pain    Sulfa Antibiotics Rash     Current Outpatient Medications  Medication Sig Dispense Refill   Ascorbic Acid  (VITAMIN C) 1000 MG tablet Take 1,000 mg by mouth daily.     b complex vitamins capsule Take 1 capsule by mouth daily.     cholecalciferol (VITAMIN  D3) 25 MCG (1000 UNIT) tablet Take 1,000 Units by mouth daily.     fexofenadine (ALLEGRA) 180 MG tablet Take 180 mg by mouth daily. As needed     finasteride  (PROSCAR ) 5 MG tablet Take 5 mg by mouth daily.     fluticasone  (FLONASE ) 50 MCG/ACT nasal spray Place 2 sprays into both nostrils daily. PRN     glipiZIDE  (GLUCOTROL  XL) 2.5 MG 24 hr tablet Take 2.5 mg by mouth daily with breakfast.     L-Arginine  500 MG CAPS Take 500 mg by mouth in the morning and at bedtime.     lisinopril  (ZESTRIL ) 40 MG tablet Take 40 mg by mouth in the morning and at bedtime.     Magnesium  Cl-Calcium  Carbonate (SLOW-MAG PO) Take 2 tablets by mouth in the morning and at bedtime.     methocarbamol  (ROBAXIN ) 500 MG tablet Take 1 tablet (500 mg total) by mouth 4 (four) times daily. (Patient taking differently: Take 500 mg by mouth 4 (four) times daily. PRN) 45 tablet 0   omeprazole (PRILOSEC) 20 MG capsule Take 20 mg by mouth daily.      vitamin B-12 (  CYANOCOBALAMIN) 1000 MCG tablet Take 1,000 mcg by mouth daily. (Patient not taking: Reported on 07/07/2022)     No current facility-administered medications for this visit.     Past Surgical History:  Procedure Laterality Date   ANTERIOR CERVICAL DECOMP/DISCECTOMY FUSION N/A 01/06/2022   Procedure: Anterior Cervical Decompression Fusion Cervical three-four, Cervical four-five;  Surgeon: Onetha Kuba, MD;  Location: [facility] OR;  Service: Neurosurgery;  Laterality: N/A;   APPENDECTOMY  1961   CATARACT EXTRACTION, BILATERAL  2016   hammertoe repair Right 2016   LEFT HEART CATH AND CORONARY ANGIOGRAPHY N/A 08/27/2018   Procedure: LEFT HEART CATH AND CORONARY ANGIOGRAPHY;  Surgeon: Burnard Debby LABOR, MD;  Location: [facility] INVASIVE CV LAB;  Service: Cardiovascular;  Laterality: N/A;   NASAL SINUS SURGERY  2000   deviated septum, polyp removal   SHOULDER SURGERY Left 2007     Allergies  Allergen Reactions   Alpha-Gal    Amlodipine     Bottomed out BP   Ezetimibe     Zetia - made pt  feel like he had the flu   Metformin Diarrhea   Statins     Joint pain    Sulfa Antibiotics Rash      Family History  Problem Relation Age of Onset   Breast cancer Paternal Grandmother    Hypertension Mother    Hypertension Father      Social History Terry Conley reports that he quit smoking about 18 years ago. His smoking use included cigarettes. He started smoking about 48 years ago. He has a 22.5 pack-year smoking history. He has never used smokeless tobacco. Terry Conley reports current alcohol use.     Physical Examination Today's Vitals   10/17/24 1043 10/17/24 1048  BP: (!) 158/88 (!) 152/90  Pulse: 83   SpO2: 96%   Weight: 243 lb (110.2 kg)   Height: 6' 1 (1.854 m)    Body mass index is 32.06 kg/m.  Gen: resting comfortably, no acute distress HEENT: no scleral icterus, pupils equal round and reactive, no palptable cervical adenopathy,  CV: RRR, no mrg, no jvd Resp: Clear to auscultation bilaterally GI: abdomen is soft, non-tender, non-distended, normal bowel sounds, no hepatosplenomegaly MSK: extremities are warm, no edema.  Skin: warm, no rash Neuro:  no focal deficits Psych: appropriate affect   Diagnostic Studies Cardiac Catheterization: 2018-09-15 Ost LAD to Prox LAD lesion is 35% stenosed. Prox LAD to Mid LAD lesion is 20% stenosed. Mid LAD lesion is 5% stenosed. Ost 1st Mrg lesion is 10% stenosed.   Mild nonobstructive CAD with evidence for 30%-40% plaque in the proximal LAD, 20% mild calcification of the mid LAD followed by a more calcified segment with narrowing of less than 5%.  There was normal flow distally in the LAD wrapped around the apex.  The circumflex vessel was a codominant vessel.  There was minimal less than 10% plaque in the OM1 vessel.  The remainder of the vessel was free of significant disease.  The RCA was a codominant vessel and appeared normal.   Attempt at crossing into the left ventricle was unsuccessful due to radial artery  spasm.  2018 echo [facility]: LVEF 55-60%, mild LAE, normal RV  Assessment and Plan  1.Palpitations - we will plan on a 7 day zio patch to further evaluate - EKG today shows NSR - reports some orthostatic dizziness at times, orthostatics are negative today. Did encouraged increased oral hydration which has been limited.   2. CAD - mild CAD by prior  cath - no significant symptoms - would start ASA 81mg  daily. Has been statin intolerant  3. HLD - statin intolerant. Did not tolerate zetia or repatha per his report - f/u labs with pcp, pending results may warrant lipid clinic evaluation at some point      Dorn PHEBE Ross, M.D.

## 2024-10-17 NOTE — Patient Instructions (Addendum)
 Medication Instructions:   Begin Aspirin  81mg  daily  Continue all other medications.     Labwork:  none  Testing/Procedures:  Your physician has recommended that you wear a 1 week event monitor. Event monitors are medical devices that record the heart's electrical activity. Doctors most often us  these monitors to diagnose arrhythmias. Arrhythmias are problems with the speed or rhythm of the heartbeat. The monitor is a small, portable device. You can wear one while you do your normal daily activities. This is usually used to diagnose what is causing palpitations/syncope (passing out).   Follow-Up:  Office will contact with results via phone, letter or mychart.    4-6 weeks    Any Other Special Instructions Will Be Listed Below (If Applicable).   If you need a refill on your cardiac medications before your next appointment, please call your pharmacy.

## 2024-10-18 ENCOUNTER — Ambulatory Visit: Attending: Cardiology

## 2024-10-18 ENCOUNTER — Encounter: Payer: Self-pay | Admitting: *Deleted

## 2024-10-18 ENCOUNTER — Other Ambulatory Visit: Payer: Self-pay | Admitting: Cardiology

## 2024-10-18 DIAGNOSIS — R002 Palpitations: Secondary | ICD-10-CM

## 2024-10-19 ENCOUNTER — Encounter: Payer: Self-pay | Admitting: Family Medicine

## 2024-11-15 ENCOUNTER — Encounter (INDEPENDENT_AMBULATORY_CARE_PROVIDER_SITE_OTHER): Payer: Self-pay | Admitting: *Deleted

## 2024-11-25 ENCOUNTER — Encounter (INDEPENDENT_AMBULATORY_CARE_PROVIDER_SITE_OTHER): Payer: Self-pay

## 2024-12-05 ENCOUNTER — Encounter: Payer: Self-pay | Admitting: Nurse Practitioner

## 2024-12-05 ENCOUNTER — Ambulatory Visit: Attending: Nurse Practitioner | Admitting: Nurse Practitioner

## 2024-12-05 VITALS — BP 161/82 | HR 75 | Ht 72.0 in | Wt 242.6 lb

## 2024-12-05 DIAGNOSIS — I10 Essential (primary) hypertension: Secondary | ICD-10-CM | POA: Diagnosis not present

## 2024-12-05 DIAGNOSIS — I2510 Atherosclerotic heart disease of native coronary artery without angina pectoris: Secondary | ICD-10-CM

## 2024-12-05 DIAGNOSIS — R0602 Shortness of breath: Secondary | ICD-10-CM | POA: Diagnosis not present

## 2024-12-05 DIAGNOSIS — R002 Palpitations: Secondary | ICD-10-CM

## 2024-12-05 DIAGNOSIS — N183 Chronic kidney disease, stage 3 unspecified: Secondary | ICD-10-CM

## 2024-12-05 DIAGNOSIS — E785 Hyperlipidemia, unspecified: Secondary | ICD-10-CM

## 2024-12-05 NOTE — Progress Notes (Signed)
 Cardiology Office Note   Date:  12/05/2024  ID:  Dirck Butch, DOB Jun 01, 1956, MRN [id number] PCP: Toribio Jerel MATSU, MD  [facility] Providers Cardiologist:  Alvan Carrier, MD     History of Present Illness Terry Conley is a 68 y.o. male with a PMH of CAD, HTN, T2DM, HLD, palpitations, alpha gal, CKD stage 3, who presents today for scheduled follow-up.   Last seen by Dr. Alvan on 10/17/2024. Noted some palptiations, symptoms ongoing for x 1 year, increasing in severity and frequency. Was mentioned previous notes stated ? Possible IST. Pt noted occasional orthostatic dizziness. 7 day Zio was arranged.   He is here for follow-up. He is here with his wife. He notices some improvement in symptoms/palpitations since last office visit. Overall, says he has some good days and some other days that he feels worn out. Does notice some shortness of breath with his symptoms. Tells me that he just started a new BP medication 4 days ago, irbesartan. Denies any chest pain, syncope, presyncope, dizziness, orthopnea, PND, swelling or significant weight changes, acute bleeding, or claudication.  ROS: Negative. See HPI.   Studies Reviewed  EKG: EKG is not ordered today.   Cardiac monitor 10/2024:    Patch Wear Time:  7 days and 0 hours (2025-10-24T10:35:14-0400 to 2025-10-31T10:38:01-399)   Patient had a min HR of 63 bpm, max HR of 197 bpm, and avg HR of 89 bpm. Predominant underlying rhythm was Sinus Rhythm. 11 Supraventricular Tachycardia runs occurred, the run with the fastest interval lasting 6 beats with a max rate of 197 bpm, the  longest lasting 13.0 secs with an avg rate of 173 bpm. Isolated SVEs were rare (<1.0%), SVE Couplets were rare (<1.0%), and SVE Triplets were rare (<1.0%). Isolated VEs were rare (<1.0%), and no VE Couplets or VE Triplets were present. Ventricular  Bigeminy was present.   LHC 07/2018:  Ost LAD to Prox LAD lesion is 35% stenosed. Prox LAD to Mid LAD lesion  is 20% stenosed. Mid LAD lesion is 5% stenosed. Ost 1st Mrg lesion is 10% stenosed.   Mild nonobstructive CAD with evidence for 30%-40% plaque in the proximal LAD, 20% mild calcification of the mid LAD followed by a more calcified segment with narrowing of less than 5%.  There was normal flow distally in the LAD wrapped around the apex.  The circumflex vessel was a codominant vessel.  There was minimal less than 10% plaque in the OM1 vessel.  The remainder of the vessel was free of significant disease.  The RCA was a codominant vessel and appeared normal.   Attempt at crossing into the left ventricle was unsuccessful due to radial artery spasm.   RECOMMENDATION: High potency statin therapy and attempt to induce plaque regression.  Medical therapy for CAD. Recommend Aspirin  81mg  daily for moderate CAD.  Echo in 2018: Results were benign, normal LVEF.   HYPERTENSION CONTROL Vitals:   12/05/24 0756 12/05/24 0837  BP: (!) 162/88 (!) 161/82    The patient's blood pressure is elevated above target today.  In order to address the patient's elevated BP: Blood pressure will be monitored at home to determine if medication changes need to be made.; Follow up with general cardiology has been recommended.          Physical Exam VS:  BP (!) 161/82   Pulse 75   Ht 6' (1.829 m)   Wt 242 lb 9.6 oz (110 kg)   SpO2 96%   BMI 32.90 kg/m  Wt Readings from Last 3 Encounters:  12/05/24 242 lb 9.6 oz (110 kg)  10/17/24 243 lb (110.2 kg)  07/07/22 236 lb (107 kg)    GEN: Obese, 68 y.o. male in no acute distress NECK: No JVD; No carotid bruits CARDIAC: S1/S2, RRR, no murmurs, rubs, gallops RESPIRATORY:  Clear to auscultation without rales, wheezing or rhonchi  ABDOMEN: Soft, non-tender, non-distended EXTREMITIES:  No edema; No deformity   ASSESSMENT AND PLAN  Palpations Reviewed recent monitor report - see preliminary report above. Does notice symptoms with faster HR. Will obtain Echo  as noted below and if results are WNL, will trial low dose Toprol  XL. Heart healthy diet and regular cardiovascular exercise encouraged. Care and ED precautions discussed.   CAD Stable with no anginal symptoms. No indication for ischemic evaluation.  Continue aspirin , Zetia, irbesartan.  Will obtain echo as noted below.  No medication changes at this time. Heart healthy diet and regular cardiovascular exercise encouraged.   HTN Blood pressure elevated not at goal.  Just started blood pressure medication a few days ago. Discussed to monitor BP at home at least 2 hours after medications and sitting for 5-10 minutes.  If no improvement by next office visit, plan to initiate beta-blocker as noted above/increase irbesartan and have room to titrate. Heart healthy diet and regular cardiovascular exercise encouraged.   Shortness of breath Etiology multifactorial.  Updating echocardiogram. Care and ED precautions discussed.   HLD No recent lipid panel on file.  Will request most recent labs from his PCP.  Continue Zetia.  Does have known history of statin intolerance, Repatha caused high blood sugars. Heart healthy diet and regular cardiovascular exercise encouraged.  If no improvement with future lab values, plan to address/consider Leqvio.   CKD stage 3  No recent labs on file.  Will request most recent labs from his PCP.  Avoid nephrotoxic agents.  Continue follow-up with PCP.   Dispo: Follow-up with MD/APP in 6 weeks or sooner any changes.  Signed, Almarie Crate, NP

## 2024-12-05 NOTE — Patient Instructions (Addendum)
 Medication Instructions:  Your physician recommends that you continue on your current medications as directed. Please refer to the Current Medication list given to you today.   Labwork: Requested labs from Primary Care Physician   Testing/Procedures: Your physician has requested that you have an echocardiogram. Echocardiography is a painless test that uses sound waves to create images of your heart. It provides your doctor with information about the size and shape of your heart and how well your heart's chambers and valves are working. This procedure takes approximately one hour. There are no restrictions for this procedure. Please do NOT wear cologne, perfume, aftershave, or lotions (deodorant is allowed). Please arrive 15 minutes prior to your appointment time.  Please note: We ask at that you not bring children with you during ultrasound (echo/ vascular) testing. Due to room size and safety concerns, children are not allowed in the ultrasound rooms during exams. Our front office staff cannot provide observation of children in our lobby area while testing is being conducted. An adult accompanying a patient to their appointment will only be allowed in the ultrasound room at the discretion of the ultrasound technician under special circumstances. We apologize for any inconvenience.   Follow-Up: Your physician recommends that you schedule a follow-up appointment in: 6 weeks  Any Other Special Instructions Will Be Listed Below (If Applicable). Please bring Blood pressure log when you come for your next office visit.   Thank you for choosing [facility]!     If you need a refill on your cardiac medications before your next appointment, please call your pharmacy.

## 2024-12-06 ENCOUNTER — Encounter: Payer: Self-pay | Admitting: Cardiology

## 2024-12-08 ENCOUNTER — Telehealth: Payer: Self-pay | Admitting: *Deleted

## 2024-12-08 NOTE — Telephone Encounter (Signed)
°  Procedure: COLONOSCOPY  Estimated body mass index is 32.9 kg/m as calculated from the following:   Height as of 12/05/24: 6' (1.829 m).   Weight as of 12/05/24: 242 lb 9.6 oz (110 kg).   Have you had a colonoscopy before?  10+ Years ago  Do you have family history of colon cancer?  no  Do you have a family history of polyps? no  Previous colonoscopy with polyps removed? Yes 10+ years ago  Do you have a history colorectal cancer?   no  Are you diabetic?  Yes, type 2  Do you have a prosthetic or mechanical heart valve? no  Do you have a pacemaker/defibrillator?   no  Have you had endocarditis/atrial fibrillation?  Unsure getting cardiology workup  Do you use supplemental oxygen/CPAP?  no  Have you had joint replacement within the last 12 months?  no  Do you tend to be constipated or have to use laxatives?  no   Do you have history of alcohol use? If yes, how much and how often.  Yes 2-3 a week  Do you have history or are you using drugs? If yes, what do are you  using?  no  Have you ever had a stroke/heart attack?  no  Have you ever had a heart or other vascular stent placed,?no  Do you take weight loss medication? no   Do you take any blood-thinning medications such as: (Plavix, aspirin , Coumadin, Aggrenox, Brilinta, Xarelto, Eliquis, Pradaxa, Savaysa or Effient)? no  If yes we need the name, milligram, dosage and who is prescribing doctor:               Current Outpatient Medications  Medication Sig Dispense Refill   acetaminophen  (TYLENOL ) 650 MG CR tablet Take 650 mg by mouth every 8 (eight) hours as needed.     Ascorbic Acid  (VITAMIN C) 1000 MG tablet Take 1,000 mg by mouth daily.     aspirin  EC 81 MG tablet Take 1 tablet (81 mg total) by mouth daily. Swallow whole.     cholecalciferol (VITAMIN D3) 25 MCG (1000 UNIT) tablet Take 1,000 Units by mouth daily.     ezetimibe (ZETIA) 10 MG tablet Take 10 mg by mouth daily.     fexofenadine (ALLEGRA) 180 MG tablet  Take 180 mg by mouth daily. As needed     glipiZIDE  (GLUCOTROL  XL) 10 MG 24 hr tablet Take 10 mg by mouth daily with breakfast.     Magnesium  Cl-Calcium  Carbonate (SLOW-MAG PO) Take 2 tablets by mouth in the morning and at bedtime.     omeprazole (PRILOSEC) 20 MG capsule Take 20 mg by mouth daily.      potassium chloride SA (KLOR-CON M) 20 MEQ tablet Take 20 mEq by mouth daily.     testosterone  cypionate (DEPOTESTOSTERONE CYPIONATE) 200 MG/ML injection Inject 200 mg into the muscle every 14 (fourteen) days.     tolterodine (DETROL LA) 4 MG 24 hr capsule Take 4 mg by mouth daily.     No current facility-administered medications for this visit.    Allergies[1]      [1]  Allergies Allergen Reactions   Alpha-Gal    Amlodipine     Bottomed out BP   Codeine Nausea Only   Ezetimibe     Zetia - made pt feel like he had the flu   Metformin Diarrhea   Statins     Joint pain    Sulfa Antibiotics Rash

## 2024-12-11 NOTE — Telephone Encounter (Signed)
 Currently undergoing cardiac wokrup with CAD and shortness of breath, recently started on new cardiac meds and scheduled for ECHO the end of this week. Recommend recall OV in 2-3 months to discuss colonoscopy given undergoing cardiac workup.

## 2024-12-12 NOTE — Telephone Encounter (Signed)
 NIC'd 2-3 month OV follow up

## 2024-12-15 ENCOUNTER — Ambulatory Visit

## 2025-01-04 ENCOUNTER — Ambulatory Visit: Attending: Nurse Practitioner

## 2025-01-04 DIAGNOSIS — R0602 Shortness of breath: Secondary | ICD-10-CM | POA: Diagnosis present

## 2025-01-05 LAB — ECHOCARDIOGRAM COMPLETE
AR max vel: 5.46 cm2
AV Peak grad: 11.8 mmHg
Ao pk vel: 1.72 m/s
Area-P 1/2: 3.97 cm2
Calc EF: 65.6 %
S' Lateral: 2.5 cm
Single Plane A2C EF: 64.2 %
Single Plane A4C EF: 66.5 %

## 2025-01-06 ENCOUNTER — Telehealth: Payer: Self-pay | Admitting: *Deleted

## 2025-01-06 MED ORDER — METOPROLOL SUCCINATE ER 25 MG PO TB24: 12.5000 mg | ORAL_TABLET | Freq: Every day | ORAL | 6 refills | Status: DC

## 2025-01-06 NOTE — Telephone Encounter (Signed)
 Patient notified and verbalized understanding.   New prescription sent to [facility] now.

## 2025-01-06 NOTE — Telephone Encounter (Signed)
 PER ALMARIE CRATE, NP -   Hello! I was alerted that Mr. Terry Conley's blood pressure has been elevated recently by Tonya today who did his Echo. Let's begin Toprol  XL 12.5 mg daily and let's get an EKG on him next week when he comes to see me for an appt. Thanks!

## 2025-01-10 ENCOUNTER — Ambulatory Visit: Attending: Nurse Practitioner | Admitting: Nurse Practitioner

## 2025-01-10 ENCOUNTER — Encounter: Payer: Self-pay | Admitting: Nurse Practitioner

## 2025-01-10 VITALS — BP 140/70 | HR 95 | Ht 73.0 in | Wt 245.4 lb

## 2025-01-10 DIAGNOSIS — I10 Essential (primary) hypertension: Secondary | ICD-10-CM | POA: Insufficient documentation

## 2025-01-10 DIAGNOSIS — N183 Chronic kidney disease, stage 3 unspecified: Secondary | ICD-10-CM | POA: Diagnosis present

## 2025-01-10 DIAGNOSIS — I2510 Atherosclerotic heart disease of native coronary artery without angina pectoris: Secondary | ICD-10-CM | POA: Diagnosis present

## 2025-01-10 DIAGNOSIS — R0989 Other specified symptoms and signs involving the circulatory and respiratory systems: Secondary | ICD-10-CM | POA: Diagnosis present

## 2025-01-10 DIAGNOSIS — E785 Hyperlipidemia, unspecified: Secondary | ICD-10-CM | POA: Insufficient documentation

## 2025-01-10 DIAGNOSIS — D509 Iron deficiency anemia, unspecified: Secondary | ICD-10-CM | POA: Insufficient documentation

## 2025-01-10 DIAGNOSIS — R002 Palpitations: Secondary | ICD-10-CM | POA: Diagnosis present

## 2025-01-10 MED ORDER — METOPROLOL SUCCINATE ER 25 MG PO TB24: 25.0000 mg | ORAL_TABLET | Freq: Every day | ORAL | 6 refills | Status: DC

## 2025-01-10 MED ORDER — METOPROLOL SUCCINATE ER 25 MG PO TB24: 25.0000 mg | ORAL_TABLET | Freq: Every day | ORAL | 3 refills | Status: AC

## 2025-01-10 NOTE — Progress Notes (Unsigned)
 " Cardiology Office Note   Date:  01/10/2025 ID:  Terry Conley, DOB [birth year], MRN [id number] PCP: Terry Jerel MATSU, MD  [facility] Providers Cardiologist:  Terry Carrier, MD     History of Present Illness Terry Conley is a 69 y.o. male with a PMH of CAD, HTN, T2DM, HLD, palpitations, alpha gal, CKD stage 3, who presents today for scheduled follow-up.   Last seen by Dr. Alvan on 10/17/2024. Noted some palptiations, symptoms ongoing for x 1 year, increasing in severity and frequency. Was mentioned previous notes stated ? Possible IST. Pt noted occasional orthostatic dizziness. 7 day Zio was arranged.   12/05/2025 - He is here for follow-up. He is here with his wife. He notices some improvement in symptoms/palpitations since last office visit. Overall, says he has some good days and some other days that he feels worn out. Does notice some shortness of breath with his symptoms. Tells me that he just started a new BP medication 4 days ago, irbesartan. Denies any chest pain, syncope, presyncope, dizziness, orthopnea, PND, swelling or significant weight changes, acute bleeding, or claudication.  01/10/2025 -here for follow-up and admits to sensation of hearing his heartbeat when lying on his left side, causes him to wake up.  Denies any chest pain or shortness of breath.  Admits to ridges in his fingernails and wants to know what his iron levels look like. Denies any chest pain, shortness of breath, syncope, presyncope, dizziness, orthopnea, PND, swelling or significant weight changes, acute bleeding, or claudication.  ROS: Negative. See HPI.   Studies Reviewed  EKG:  EKG Interpretation Date/Time:  Tuesday January 10 2025 08:34:11 EST Ventricular Rate:  95 PR Interval:  160 QRS Duration:  92 QT Interval:  342 QTC Calculation: 429 R Axis:   44  Text Interpretation: Normal sinus rhythm Normal ECG When compared with ECG of 17-Oct-2024 10:47, No significant change was found  Confirmed by Terry Conley [phone]) on 01/10/2025 8:34:48 AM   Echo 12/2024:   1. Left ventricular ejection fraction, by estimation, is 60 to 65%. The  left ventricle has normal function. The left ventricle has no regional  wall motion abnormalities. There is mild left ventricular hypertrophy.  Left ventricular diastolic parameters  are consistent with Grade I diastolic dysfunction (impaired relaxation).  Elevated left ventricular end-diastolic pressure.   2. Right ventricular systolic function is normal. The right ventricular  size is normal.   3. The mitral valve is normal in structure. Mild mitral valve  regurgitation. No evidence of mitral stenosis.   4. The aortic valve is tricuspid. There is mild calcification of the  aortic valve. Aortic valve regurgitation is not visualized. No aortic  stenosis is present.   5. Aortic dilatation noted. There is mild dilatation of the ascending  aorta, measuring 42 mm.   Comparison(s): A prior study was performed on 05/26/2017. EF 55-60%. Left  atrium mildly dilated. Trivial aortic regurgitation.  Cardiac monitor 10/2024:    Patch Wear Time:  7 days and 0 hours (2025-10-24T10:35:14-0400 to 2025-10-31T10:38:01-399)   Patient had a min HR of 63 bpm, max HR of 197 bpm, and avg HR of 89 bpm. Predominant underlying rhythm was Sinus Rhythm. 11 Supraventricular Tachycardia runs occurred, the run with the fastest interval lasting 6 beats with a max rate of 197 bpm, the  longest lasting 13.0 secs with an avg rate of 173 bpm. Isolated SVEs were rare (<1.0%), SVE Couplets were rare (<1.0%), and SVE Triplets were rare (<1.0%). Isolated VEs were  rare (<1.0%), and no VE Couplets or VE Triplets were present. Ventricular  Bigeminy was present.   LHC 07/2018:  Ost LAD to Prox LAD lesion is 35% stenosed. Prox LAD to Mid LAD lesion is 20% stenosed. Mid LAD lesion is 5% stenosed. Ost 1st Mrg lesion is 10% stenosed.   Mild nonobstructive CAD with evidence for  30%-40% plaque in the proximal LAD, 20% mild calcification of the mid LAD followed by a more calcified segment with narrowing of less than 5%.  There was normal flow distally in the LAD wrapped around the apex.  The circumflex vessel was a codominant vessel.  There was minimal less than 10% plaque in the OM1 vessel.  The remainder of the vessel was free of significant disease.  The RCA was a codominant vessel and appeared normal.   Attempt at crossing into the left ventricle was unsuccessful due to radial artery spasm.   RECOMMENDATION: High potency statin therapy and attempt to induce plaque regression.  Medical therapy for CAD. Recommend Aspirin  81mg  daily for moderate CAD.  Echo in 2018: Results were benign, normal LVEF.       Physical Exam VS:  BP (!) 140/70   Pulse 95   Ht 6' 1 (1.854 m)   Wt 245 lb 6.4 oz (111.3 kg)   SpO2 94%   BMI 32.38 kg/m        Wt Readings from Last 3 Encounters:  01/10/25 245 lb 6.4 oz (111.3 kg)  12/05/24 242 lb 9.6 oz (110 kg)  10/17/24 243 lb (110.2 kg)    GEN: Obese, 69 y.o. male in no acute distress NECK: No JVD; left carotid bruit, no right carotid bruit. CARDIAC: S1/S2, RRR, no murmurs, rubs, gallops RESPIRATORY:  Clear to auscultation without rales, wheezing or rhonchi  ABDOMEN: Soft, non-tender, non-distended EXTREMITIES:  No edema; No deformity   ASSESSMENT AND PLAN  Palpations Reviewed recent monitor report - see preliminary report above. Does notice symptoms, noticed when laying on left side. Will increase Toprol  XL to 25 mg daily.  Heart healthy diet and regular cardiovascular exercise encouraged. Care and ED precautions discussed.   CAD Stable with no anginal symptoms. No indication for ischemic evaluation.  Continue aspirin , Zetia, irbesartan. Will increase Toprol  XL as noted above. No other medication changes at this time. Heart healthy diet and regular cardiovascular exercise encouraged.   HTN Blood pressure elevated not at  goal. Discussed to monitor BP at home at least 2 hours after medications and sitting for 5-10 minutes.  Increasing Toprol -XL as noted above.  No other medication changes at this time.  Heart healthy diet and regular cardiovascular exercise encouraged.   HLD No recent lipid panel on file.  Will obtain FLP and LFT.  Continue Zetia.  Does have known history of statin intolerance, Repatha caused high blood sugars. Heart healthy diet and regular cardiovascular exercise encouraged.  If no improvement with future lab values, plan to address/consider Leqvio.  5.  Left carotid bruit Noted on exam today.  Tells me that his dad had a history of carotid blockages with interventional surgery.  Will obtain carotid duplex for further evaluation.  6.  Iron deficiency anemia Most recent labs reviewed that revealed this.  Does show some signs and symptoms of this as well.  Will place referral to hematology for further evaluation and management.  7. CKD stage 3  No recent labs on file.  Will request most recent labs from his PCP.  Avoid nephrotoxic agents.  Continue follow-up  with PCP.   Dispo: Follow-up with MD/APP in 3 months or sooner any changes.  Signed, Almarie Crate, NP   "

## 2025-01-10 NOTE — Patient Instructions (Addendum)
 Medication Instructions:  Your physician has recommended you make the following change in your medication:  Increase Metoprolol  Succinate to 25 mg once daily Continue taking all other medications as prescribed  Labwork: Have the following labs completed at your Primary Care Physician Fasting Lipid Panel and LFTs  Testing/Procedures: Your physician has requested that you have a carotid duplex. This test is an ultrasound of the carotid arteries in your neck. It looks at blood flow through these arteries that supply the brain with blood. Allow one hour for this exam. There are no restrictions or special instructions.   Follow-Up: Your physician recommends that you schedule a follow-up appointment in: 3 months  Any Other Special Instructions Will Be Listed Below (If Applicable). Referral to Hematology  Thank you for choosing [facility]!      If you need a refill on your cardiac medications before your next appointment, please call your pharmacy.

## 2025-01-16 ENCOUNTER — Ambulatory Visit (INDEPENDENT_AMBULATORY_CARE_PROVIDER_SITE_OTHER): Admitting: Otolaryngology

## 2025-01-16 ENCOUNTER — Encounter (INDEPENDENT_AMBULATORY_CARE_PROVIDER_SITE_OTHER): Payer: Self-pay | Admitting: Otolaryngology

## 2025-01-16 ENCOUNTER — Other Ambulatory Visit (HOSPITAL_COMMUNITY)
Admission: RE | Admit: 2025-01-16 | Discharge: 2025-01-16 | Disposition: A | Source: Ambulatory Visit | Attending: Otolaryngology | Admitting: Otolaryngology

## 2025-01-16 VITALS — BP 146/89 | HR 96 | Ht 72.0 in | Wt 239.0 lb

## 2025-01-16 DIAGNOSIS — D1030 Benign neoplasm of unspecified part of mouth: Secondary | ICD-10-CM | POA: Diagnosis present

## 2025-01-16 DIAGNOSIS — H9313 Tinnitus, bilateral: Secondary | ICD-10-CM | POA: Diagnosis not present

## 2025-01-16 NOTE — Progress Notes (Signed)
 CC: Left buccal mucosal mass, bilateral tinnitus  Discussed the use of AI scribe software for clinical note transcription with the patient, who gave verbal consent to proceed.  History of Present Illness Terry Conley is a 69 year old male who presents with a left intraoral mucosal lesion and associated hearing loss with tinnitus.  He reports an intraoral mass on the left side, present for approximately eight months. He initially attributed it to trauma from biting, but denies having teeth in the affected area. He reports that the mass sometimes feels like it crosses over to his throat and the side of his tongue. He describes it as flat and sticking out, but is unsure if it has increased in size. He occasionally experiences a choking sensation related to the mass. No prior interventions have been performed.  He also reports bilateral tinnitus and hearing loss. He has previously undergone audiometric testing. He has trialed hearing aids from [employer] and online sources, with improved hearing using [employer] devices but experiences discomfort and a sensation of aural fullness with his use.   Past Medical History:  Diagnosis Date   CAD (coronary artery disease)    a. nonobstructive by cath 2019.   Cancer [facility])    prostate   CKD (chronic kidney disease), stage II    Diabetes (HCC)    Dilated aortic root    a. 4cm by CT 2019.   ED (erectile dysfunction)    Fatty liver    by imaging   GERD (gastroesophageal reflux disease)    History of kidney stones    Hypertension     Past Surgical History:  Procedure Laterality Date   ANTERIOR CERVICAL DECOMP/DISCECTOMY FUSION N/A 01/06/2022   Procedure: Anterior Cervical Decompression Fusion Cervical three-four, Cervical four-five;  Surgeon: Onetha Kuba, MD;  Location: [facility] OR;  Service: Neurosurgery;  Laterality: N/A;   APPENDECTOMY  1961   CATARACT EXTRACTION, BILATERAL  2016   hammertoe repair Right 2016   LEFT HEART CATH AND CORONARY  ANGIOGRAPHY N/A 08/27/2018   Procedure: LEFT HEART CATH AND CORONARY ANGIOGRAPHY;  Surgeon: Burnard Debby LABOR, MD;  Location: [facility] INVASIVE CV LAB;  Service: Cardiovascular;  Laterality: N/A;   NASAL SINUS SURGERY  2000   deviated septum, polyp removal   SHOULDER SURGERY Left 2007    Family History  Problem Relation Age of Onset   Breast cancer Paternal Grandmother    Hypertension Mother    Hypertension Father     Social History:  reports that he quit smoking about 18 years ago. His smoking use included cigarettes. He started smoking about 48 years ago. He has a 22.5 pack-year smoking history. He has never used smokeless tobacco. He reports current alcohol use. He reports that he does not use drugs.  Allergies: Allergies[1]  Prior to Admission medications  Medication Sig Start Date End Date Taking? Authorizing Provider  acetaminophen  (TYLENOL ) 650 MG CR tablet Take 650 mg by mouth every 8 (eight) hours as needed. 03/12/15  Yes [provider]  Ascorbic Acid  (VITAMIN C) 1000 MG tablet Take 1,000 mg by mouth daily.   Yes [provider]  aspirin  EC 81 MG tablet Take 1 tablet (81 mg total) by mouth daily. Swallow whole. 10/17/24  Yes BranchDorn FALCON, MD  cholecalciferol (VITAMIN D3) 25 MCG (1000 UNIT) tablet Take 1,000 Units by mouth daily.   Yes [provider]  ezetimibe (ZETIA) 10 MG tablet Take 10 mg by mouth daily. 11/10/24  Yes [provider]  fexofenadine (ALLEGRA) 180 MG tablet Take 180 mg by mouth daily. As needed   Yes [provider]  glipiZIDE  (GLUCOTROL  XL) 10 MG 24 hr tablet Take 10 mg by mouth daily with breakfast. 04/11/24  Yes [provider]  Magnesium  Cl-Calcium  Carbonate (SLOW-MAG PO) Take 2 tablets by mouth in the morning and at bedtime.   Yes [provider]  omeprazole (PRILOSEC) 20 MG capsule Take 20 mg by mouth daily.    Yes [provider]  potassium chloride SA (KLOR-CON M) 20 MEQ tablet Take 20  mEq by mouth daily.   Yes [provider]  testosterone  cypionate (DEPOTESTOSTERONE CYPIONATE) 200 MG/ML injection Inject 200 mg into the muscle every 14 (fourteen) days.   Yes [provider]  tolterodine (DETROL LA) 4 MG 24 hr capsule Take 4 mg by mouth daily. 12/01/24 11/26/25 Yes [provider]  metoprolol  succinate (TOPROL  XL) 25 MG 24 hr tablet Take 1 tablet (25 mg total) by mouth daily. 01/10/25   Miriam Norris, NP  omeprazole (PRILOSEC OTC) 20 MG tablet Take 20 mg by mouth daily. Patient not taking: Reported on 01/16/2025    [provider]    Blood pressure (!) 146/89, pulse 96, height 6' (1.829 m), weight 239 lb (108.4 kg), SpO2 93%. Exam: General: Communicates without difficulty, well nourished, no acute distress. Head: Normocephalic, no evidence injury, no tenderness, facial buttresses intact without stepoff. Face/sinus: No tenderness to palpation and percussion. Facial movement is normal and symmetric. Eyes: PERRL, EOMI. No scleral icterus, conjunctivae clear. Neuro: CN II exam reveals vision grossly intact.  No nystagmus at any point of gaze. Ears: Auricles well formed without lesions.  Ear canals are intact without mass or lesion.  No erythema or edema is appreciated.  The TMs are intact without fluid. Nose: External evaluation reveals normal support and skin without lesions.  Dorsum is intact.  Anterior rhinoscopy reveals congested mucosa over anterior aspect of inferior turbinates and intact septum.  No purulence noted. Oral:  Oral cavity and oropharynx are intact, symmetric, without erythema or edema.  A 1 cm pedunculated mass is noted on the left buccal mucosa.  Neck: Full range of motion without pain.  There is no significant lymphadenopathy.  No masses palpable.  Thyroid bed within normal limits to palpation.  Parotid glands and submandibular glands equal bilaterally without mass.  Trachea is midline. Neuro:  CN 2-12 grossly intact.   Procedure:  Excision of a 1 cm left buccal mucosal mass Anesthesia: local anesthesia with 1% lidocaine  with epinephrine.   Description: The patient is placed supine on the operating table.  After adequate local anesthesia is achieved with direct injection, a 15 blade is used to make an eliptical incision around the lesion.  The entire lesion is excised along with the full-thickness mucosa.  The specimen is sent to the pathologist for histologic identification.  The incision is closed with interrupted plain gut sutures.  The patient tolerated the procedure well..  Assessment & Plan Pedunculated left buccal mucosal mass He has a 1 cm, smooth, non-ulcerated, pedunculated mass on the left buccal mucosa, present for eight months. The lesion lacks concerning features such as ulceration or firmness. Clinical impression favors a benign etiology, likely a polyp or papilloma. Malignancy is considered unlikely based on the benign appearance. Excision was recommended for definitive diagnosis and symptom relief. - Excised the 1 cm pedunculated left buccal mucosal mass under local anesthesia. - Sent the specimen for histopathologic evaluation. - Will communicate pathology results  to him.  Hearing loss with tinnitus He reports bilateral hearing loss and tinnitus. Tinnitus is likely secondary to hearing loss. Previous hearing aids from [employer] and internet sources provided limited benefit and discomfort. Well-fitted hearing aids with tinnitus suppression features may improve both hearing loss and tinnitus. - The strategies to cope with tinnitus, including the use of masker, hearing aids, tinnitus retraining therapy, and avoidance of caffeine and alcohol are discussed.    Marca Gadsby W Clotee Schlicker 01/16/2025, 1:26 PM      [1]  Allergies Allergen Reactions   Alpha-Gal    Amlodipine     Bottomed out BP   Codeine Nausea Only   Ezetimibe     Zetia - made pt feel like he had the flu   Metformin Diarrhea   Statins     Joint pain     Sulfa Antibiotics Rash

## 2025-01-17 LAB — SURGICAL PATHOLOGY

## 2025-01-18 DIAGNOSIS — R002 Palpitations: Secondary | ICD-10-CM | POA: Diagnosis not present

## 2025-01-23 ENCOUNTER — Ambulatory Visit

## 2025-01-26 ENCOUNTER — Ambulatory Visit: Payer: Self-pay | Admitting: Nurse Practitioner

## 2025-02-06 ENCOUNTER — Ambulatory Visit: Admitting: Gastroenterology

## 2025-02-09 ENCOUNTER — Ambulatory Visit

## 2025-02-21 ENCOUNTER — Inpatient Hospital Stay

## 2025-03-06 ENCOUNTER — Ambulatory Visit: Admitting: Gastroenterology

## 2025-04-14 ENCOUNTER — Ambulatory Visit: Admitting: Nurse Practitioner
